# Patient Record
Sex: Female | Born: 1985 | Race: White | Hispanic: No | Marital: Single | Smoking: Current every day smoker
Health system: Southern US, Community
[De-identification: ages and names within clinical notes are randomized; demographics above are authoritative.]

## PROBLEM LIST (undated history)

## (undated) DIAGNOSIS — R519 Headache, unspecified: Secondary | ICD-10-CM

## (undated) DIAGNOSIS — R51 Headache: Secondary | ICD-10-CM

## (undated) HISTORY — PX: DILATION AND CURETTAGE OF UTERUS: SHX78

## (undated) HISTORY — PX: SALPINGECTOMY: SHX328

## (undated) HISTORY — PX: DILITATION & CURRETTAGE/HYSTROSCOPY WITH ESSURE: SHX5573

---
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
## 2006-02-18 ENCOUNTER — Emergency Department: Payer: Self-pay | Admitting: Emergency Medicine

## 2006-08-31 ENCOUNTER — Emergency Department: Payer: Self-pay | Admitting: Emergency Medicine

## 2007-11-27 ENCOUNTER — Emergency Department: Payer: Self-pay | Admitting: Internal Medicine

## 2007-12-15 ENCOUNTER — Emergency Department: Payer: Self-pay | Admitting: Internal Medicine

## 2008-01-19 ENCOUNTER — Emergency Department: Payer: Self-pay | Admitting: Emergency Medicine

## 2008-05-23 ENCOUNTER — Emergency Department: Payer: Self-pay | Admitting: Emergency Medicine

## 2008-06-20 ENCOUNTER — Emergency Department: Payer: Self-pay | Admitting: Emergency Medicine

## 2008-09-11 ENCOUNTER — Emergency Department: Payer: Self-pay | Admitting: Emergency Medicine

## 2009-01-14 ENCOUNTER — Emergency Department: Payer: Self-pay | Admitting: Emergency Medicine

## 2009-01-15 ENCOUNTER — Inpatient Hospital Stay: Payer: Self-pay | Admitting: Unknown Physician Specialty

## 2009-02-06 ENCOUNTER — Emergency Department: Payer: Self-pay | Admitting: Unknown Physician Specialty

## 2009-02-07 ENCOUNTER — Emergency Department: Payer: Self-pay | Admitting: Emergency Medicine

## 2009-06-14 ENCOUNTER — Emergency Department: Payer: Self-pay | Admitting: Emergency Medicine

## 2009-06-21 ENCOUNTER — Emergency Department: Payer: Self-pay | Admitting: Emergency Medicine

## 2009-08-12 ENCOUNTER — Emergency Department: Payer: Self-pay | Admitting: Emergency Medicine

## 2009-08-27 ENCOUNTER — Emergency Department: Payer: Self-pay | Admitting: Emergency Medicine

## 2009-12-10 ENCOUNTER — Emergency Department: Payer: Self-pay | Admitting: Emergency Medicine

## 2010-01-01 ENCOUNTER — Ambulatory Visit: Payer: Self-pay | Admitting: Unknown Physician Specialty

## 2010-10-19 ENCOUNTER — Emergency Department: Payer: Self-pay | Admitting: Emergency Medicine

## 2011-01-13 ENCOUNTER — Emergency Department: Payer: Self-pay | Admitting: Emergency Medicine

## 2011-09-19 ENCOUNTER — Emergency Department: Payer: Self-pay | Admitting: Emergency Medicine

## 2011-09-21 ENCOUNTER — Emergency Department: Payer: Self-pay | Admitting: Emergency Medicine

## 2011-09-25 ENCOUNTER — Emergency Department: Payer: Self-pay | Admitting: Emergency Medicine

## 2011-11-17 ENCOUNTER — Emergency Department: Payer: Self-pay | Admitting: *Deleted

## 2011-11-19 ENCOUNTER — Emergency Department: Payer: Self-pay | Admitting: Unknown Physician Specialty

## 2011-11-21 ENCOUNTER — Emergency Department: Payer: Self-pay | Admitting: Unknown Physician Specialty

## 2011-11-22 LAB — WOUND AEROBIC CULTURE

## 2012-02-02 ENCOUNTER — Emergency Department: Payer: Self-pay | Admitting: Emergency Medicine

## 2012-02-03 ENCOUNTER — Emergency Department: Payer: Self-pay | Admitting: Emergency Medicine

## 2012-04-09 ENCOUNTER — Emergency Department: Payer: Self-pay | Admitting: Emergency Medicine

## 2012-07-20 ENCOUNTER — Emergency Department: Payer: Self-pay | Admitting: Emergency Medicine

## 2012-08-12 ENCOUNTER — Ambulatory Visit: Payer: Self-pay | Admitting: Obstetrics & Gynecology

## 2012-08-12 LAB — BASIC METABOLIC PANEL
BUN: 12 mg/dL (ref 7–18)
Calcium, Total: 9.4 mg/dL (ref 8.5–10.1)
Co2: 25 mmol/L (ref 21–32)
Creatinine: 0.73 mg/dL (ref 0.60–1.30)
EGFR (African American): >60
EGFR (Non-African Amer.): >60
Glucose: 101 mg/dL — ABNORMAL HIGH (ref 65–99)
Potassium: 3.8 mmol/L (ref 3.5–5.1)
Sodium: 138 mmol/L (ref 136–145)

## 2012-08-12 LAB — PREGNANCY, URINE: Pregnancy Test, Urine: NEGATIVE m[IU]/mL

## 2012-08-24 ENCOUNTER — Ambulatory Visit: Payer: Self-pay | Admitting: Obstetrics & Gynecology

## 2013-01-05 DIAGNOSIS — N946 Dysmenorrhea, unspecified: Secondary | ICD-10-CM | POA: Insufficient documentation

## 2013-01-05 DIAGNOSIS — N809 Endometriosis, unspecified: Secondary | ICD-10-CM | POA: Insufficient documentation

## 2013-02-02 ENCOUNTER — Emergency Department: Payer: Self-pay | Admitting: Emergency Medicine

## 2013-02-02 LAB — URINALYSIS, COMPLETE
Bacteria: NONE SEEN
WBC UR: 14 /HPF (ref 0–5)

## 2013-02-02 LAB — COMPREHENSIVE METABOLIC PANEL
Albumin: 4.1 g/dL (ref 3.4–5.0)
BUN: 9 mg/dL (ref 7–18)
Bilirubin,Total: 0.2 mg/dL (ref 0.2–1.0)
Calcium, Total: 9.1 mg/dL (ref 8.5–10.1)
Co2: 23 mmol/L (ref 21–32)
Creatinine: 0.67 mg/dL (ref 0.60–1.30)
EGFR (Non-African Amer.): >60
Osmolality: 276 (ref 275–301)
Potassium: 3.4 mmol/L — ABNORMAL LOW (ref 3.5–5.1)
SGOT(AST): 25 U/L (ref 15–37)
SGPT (ALT): 22 U/L (ref 12–78)
Total Protein: 8.1 g/dL (ref 6.4–8.2)

## 2013-02-02 LAB — CBC
HCT: 39.2 % (ref 35.0–47.0)
HGB: 13.4 g/dL (ref 12.0–16.0)
MCH: 30.6 pg (ref 26.0–34.0)
MCHC: 34.1 g/dL (ref 32.0–36.0)
MCV: 90 fL (ref 80–100)
RDW: 13.1 % (ref 11.5–14.5)

## 2013-02-02 LAB — PREGNANCY, URINE: Pregnancy Test, Urine: NEGATIVE m[IU]/mL

## 2013-02-03 ENCOUNTER — Emergency Department: Payer: Self-pay | Admitting: Emergency Medicine

## 2013-02-03 LAB — CBC WITH DIFFERENTIAL/PLATELET
Basophil #: 0.1 10*3/uL (ref 0.0–0.1)
Basophil %: 0.5 %
Eosinophil #: 0.2 10*3/uL (ref 0.0–0.7)
Eosinophil %: 1.3 %
HCT: 37.4 % (ref 35.0–47.0)
Lymphocyte %: 30.7 %
MCH: 31.4 pg (ref 26.0–34.0)
MCV: 90 fL (ref 80–100)
Monocyte %: 5.7 %
Neutrophil %: 61.8 %
Platelet: 304 10*3/uL (ref 150–440)
RBC: 4.15 10*6/uL (ref 3.80–5.20)
RDW: 12.8 % (ref 11.5–14.5)
WBC: 17.7 10*3/uL — ABNORMAL HIGH (ref 3.6–11.0)

## 2013-02-03 LAB — URINALYSIS, COMPLETE
Bacteria: NONE SEEN
Bilirubin,UR: NEGATIVE
Glucose,UR: NEGATIVE mg/dL (ref 0–75)
Ketone: NEGATIVE
Protein: 100
WBC UR: 12 /HPF (ref 0–5)

## 2013-02-03 LAB — GC/CHLAMYDIA PROBE AMP

## 2013-02-05 LAB — URINE CULTURE

## 2014-06-20 ENCOUNTER — Ambulatory Visit: Payer: Self-pay | Admitting: Obstetrics & Gynecology

## 2014-07-11 ENCOUNTER — Ambulatory Visit: Payer: Self-pay | Admitting: Obstetrics & Gynecology

## 2014-07-11 LAB — CBC
HCT: 41.1 % (ref 35.0–47.0)
HGB: 13.4 g/dL (ref 12.0–16.0)
MCH: 30.7 pg (ref 26.0–34.0)
MCHC: 32.7 g/dL (ref 32.0–36.0)
MCV: 94 fL (ref 80–100)
Platelet: 277 10*3/uL (ref 150–440)
RBC: 4.37 10*6/uL (ref 3.80–5.20)
RDW: 12.7 % (ref 11.5–14.5)
WBC: 13.1 10*3/uL — ABNORMAL HIGH (ref 3.6–11.0)

## 2014-07-14 ENCOUNTER — Ambulatory Visit: Payer: Self-pay | Admitting: Obstetrics & Gynecology

## 2014-07-17 LAB — PATHOLOGY REPORT

## 2015-01-16 NOTE — Op Note (Signed)
PATIENT NAME:  Sylvia George, Sylvia George MR#:  [id number] DATE OF BIRTH:  November 17, 1985  DATE OF PROCEDURE:  08/24/2012  PREOPERATIVE DIAGNOSIS: Pelvic pain.   POSTOPERATIVE DIAGNOSES: Pelvic pain, left salpingitis, pelvic inflammatory disease, left hydrosalpinx, and Fitz-Hugh Curtis syndrome.   PROCEDURE PERFORMED: Operative laparoscopy with left salpingectomy and chromopertubation.   SURGEON: Glean Salen, M.D.   ANESTHESIA: General.   ESTIMATED BLOOD LOSS: Minimal.   COMPLICATIONS: None.   FINDINGS: Rochele Raring syndrome, pelvic inflammatory disease, left salpingitis with obstructed left tube related to hydrosalpinx, absence of right fallopian tube, and normal ovaries and uterus. No evidence for endometriosis.   DISPOSITION: To the recovery room in stable condition.   TECHNIQUE: The patient is prepped and draped in the usual sterile fashion after adequate anesthesia is obtained, in the dorsal lithotomy position. A catheter is used to drain the bladder. A uterine manipulator device is placed on the cervix and uterus with methylene blue dye syringe attached for later infiltration to test for patency of the fallopian tubes. Attention is then turned to the abdomen where a Veress needle is inserted through a 5 mm infraumbilical incision after Marcaine is used to anesthetize the skin. Veress needle placement is confirmed using the hanging drop technique and the abdomen is then insufflated with CO2 gas. A 5 mm trocar is then inserted under visualization with the laparoscope with no injuries or bleeding noted. The patient is placed in Trendelenburg positioning and the above-mentioned findings are visualized. A 5 mm trocar is placed through the right lower quadrant incision and is later converted to an 11 mm trocar when used to extract the left fallopian tube, in an Endopouch. Using the Harmonic scalpel, the left fallopian tube is identified and freed from any adhesions. Injection of methylene blue dye  through the uterine manipulator is then performed. It is seen to fill the tube but is not able to excrete out the tube due to blunting and obstructed fimbriated ends. There is no right fallopian tube and no dye passing on this side as well. Due to the significant nature of the disease of the left fallopian tube and unlikelihood of it ever being able to be patent for fertility purposes and due to the likelihood this is the etiology for the chronic pelvic pain, it is excised. The Harmonic scalpel is used to dissect along the fallopian tubes mesenteric border with preservation of the main blood supply to the ovary. Once the tube is completely freed, it is placed in an Endopouch through the 11 mm trocar, in the right lower quadrant, and removed easily. Excellent hemostasis is noted. There is no bleeding visualized. The above-mentioned findings are well documented. Gas is expelled. Trocars are removed. Dermabond is applied to the skin. Uterine manipulator is removed at this time. The patient goes to the recovery room in stable condition. All sponge, instrument, and needle counts are correct. ____________________________ R. Barnett Applebaum, MD rph:slb D: 08/24/2012 13:58:04 ET T: 08/24/2012 14:11:14 ET JOB#: [id number]  cc: Glean Salen, MD, <Dictator> Gae Dry MD ELECTRONICALLY SIGNED 08/24/2012 14:31

## 2015-01-20 NOTE — Op Note (Signed)
PATIENT NAME:  Sylvia George, Sylvia George MR#:  [id number] DATE OF BIRTH:  Apr 06, 1986  DATE OF PROCEDURE:  07/14/2014  PREOPERATIVE DIAGNOSIS: Menorrhagia.   POSTOPERATIVE DIAGNOSIS: Menorrhagia.   PROCEDURE PERFORMED: Hysteroscopy and dilation and curettage with endometrial ablation.   SURGEON: Glean Salen, M.D.   ASSISTANT: Ward MD   ANESTHESIA: General.   ESTIMATED BLOOD LOSS: Minimal.   COMPLICATIONS: None.   FINDINGS: Normal lining of the uterus.   NovaSure was performed with measurements of: Uterine length was 4.5 cm, uterine width was 4.3 cm, power was 106 and time of procedure was 80 seconds.  DISPOSITION: To the recovery room in stable condition.   TECHNIQUE: The patient is prepped and draped in the usual sterile fashion after adequate anesthesia is obtained in the dorsal lithotomy position. Bladder is drained with a Robinson catheter. Speculum is placed and the cervix is identified and grasped with a tenaculum. An endocervical curettage is performed with a Kevorkian curette. The cervix is then dilated to approximately a size 8 Hegar dilator. Hysteroscope is inserted with lactated Ringer's solution used to distend the intrauterine cavity and the above-mentioned findings are visualized. The cornua were visualized on each side without obstruction and no polyps or fibroids were seen. The hysteroscope is removed with a minimal discrepancy of fluid. The NovaSure device is placed after measurements had been obtained for cervical length and width. The instrument is deployed and a cavitation assessment test is performed without difficulty. The ablation procedure is then performed, also without difficulty, for 80 seconds. The NovaSure device is then removed and repeat visualization using the hysteroscope is performed with no signs of injury or perforation and a blanched lining to the intrauterine cavity. Hysteroscope is again removed as well as the tenaculum with no bleeding noted at the tenaculum  site. The patient goes to the recovery room in stable condition. All sponge, instrument, and needle counts are correct.  ____________________________ R. Barnett Applebaum, MD rph:sb D: 07/14/2014 08:29:59 ET T: 07/14/2014 09:28:41 ET JOB#: [id number]  cc: Glean Salen, MD, <Dictator> Gae Dry MD ELECTRONICALLY SIGNED 07/15/2014 0:04

## 2015-09-24 ENCOUNTER — Emergency Department
Admission: EM | Admit: 2015-09-24 | Discharge: 2015-09-24 | Disposition: A | Discharge status: Home / Self Care | Payer: Self-pay | Attending: Emergency Medicine | Admitting: Emergency Medicine

## 2015-09-24 ENCOUNTER — Emergency Department: Payer: Self-pay

## 2015-09-24 ENCOUNTER — Encounter: Payer: Self-pay | Admitting: Emergency Medicine

## 2015-09-24 DIAGNOSIS — Z3202 Encounter for pregnancy test, result negative: Secondary | ICD-10-CM | POA: Insufficient documentation

## 2015-09-24 DIAGNOSIS — R300 Dysuria: Secondary | ICD-10-CM | POA: Insufficient documentation

## 2015-09-24 DIAGNOSIS — R102 Pelvic and perineal pain: Secondary | ICD-10-CM | POA: Insufficient documentation

## 2015-09-24 DIAGNOSIS — R112 Nausea with vomiting, unspecified: Secondary | ICD-10-CM | POA: Insufficient documentation

## 2015-09-24 DIAGNOSIS — F17210 Nicotine dependence, cigarettes, uncomplicated: Secondary | ICD-10-CM | POA: Insufficient documentation

## 2015-09-24 DIAGNOSIS — R1032 Left lower quadrant pain: Secondary | ICD-10-CM | POA: Insufficient documentation

## 2015-09-24 LAB — URINALYSIS COMPLETE WITH MICROSCOPIC (ARMC ONLY)
BILIRUBIN URINE: NEGATIVE
Glucose, UA: NEGATIVE mg/dL
HGB URINE DIPSTICK: NEGATIVE
Ketones, ur: NEGATIVE mg/dL
Leukocytes, UA: NEGATIVE
Nitrite: NEGATIVE
PH: 5 (ref 5.0–8.0)
PROTEIN: 30 mg/dL — AB
Specific Gravity, Urine: 1.028 (ref 1.005–1.030)

## 2015-09-24 LAB — CBC
HEMATOCRIT: 39.3 % (ref 35.0–47.0)
HEMOGLOBIN: 13.5 g/dL (ref 12.0–16.0)
MCH: 30.8 pg (ref 26.0–34.0)
MCHC: 34.4 g/dL (ref 32.0–36.0)
MCV: 89.6 fL (ref 80.0–100.0)
Platelets: 317 10*3/uL (ref 150–440)
RBC: 4.39 MIL/uL (ref 3.80–5.20)
RDW: 12.8 % (ref 11.5–14.5)
WBC: 15 10*3/uL — ABNORMAL HIGH (ref 3.6–11.0)

## 2015-09-24 LAB — COMPREHENSIVE METABOLIC PANEL
ALBUMIN: 4.1 g/dL (ref 3.5–5.0)
ALT: 15 U/L (ref 14–54)
ANION GAP: 7 (ref 5–15)
AST: 17 U/L (ref 15–41)
Alkaline Phosphatase: 65 U/L (ref 38–126)
BUN: 12 mg/dL (ref 6–20)
CHLORIDE: 109 mmol/L (ref 101–111)
CO2: 23 mmol/L (ref 22–32)
Calcium: 9.2 mg/dL (ref 8.9–10.3)
Creatinine, Ser: 0.69 mg/dL (ref 0.44–1.00)
GFR calc non Af Amer: >60 mL/min (ref >60)
GLUCOSE: 126 mg/dL — AB (ref 65–99)
POTASSIUM: 3.6 mmol/L (ref 3.5–5.1)
SODIUM: 139 mmol/L (ref 135–145)
Total Bilirubin: 0.3 mg/dL (ref 0.3–1.2)
Total Protein: 7.4 g/dL (ref 6.5–8.1)

## 2015-09-24 LAB — POCT PREGNANCY, URINE: Preg Test, Ur: NEGATIVE

## 2015-09-24 LAB — LIPASE, BLOOD: LIPASE: 22 U/L (ref 11–51)

## 2015-09-24 MED ORDER — IOHEXOL 350 MG/ML SOLN
80.0000 mL | Freq: Once | INTRAVENOUS | Status: AC | PRN
  Administered 2015-09-24: 80 mL via INTRAVENOUS

## 2015-09-24 MED ORDER — TRAMADOL HCL 50 MG PO TABS: 50.0000 mg | ORAL_TABLET | Freq: Four times a day (QID) | ORAL | Status: DC | PRN

## 2015-09-24 MED ORDER — MORPHINE SULFATE (PF) 4 MG/ML IV SOLN
4.0000 mg | Freq: Once | INTRAVENOUS | Status: AC
  Administered 2015-09-24: 4 mg via INTRAVENOUS
  Filled 2015-09-24: qty 1

## 2015-09-24 MED ORDER — ONDANSETRON HCL 4 MG/2ML IJ SOLN
INTRAMUSCULAR | Status: AC
  Administered 2015-09-24: 4 mg via INTRAVENOUS
  Filled 2015-09-24: qty 2

## 2015-09-24 MED ORDER — ONDANSETRON HCL 4 MG/2ML IJ SOLN
4.0000 mg | Freq: Once | INTRAMUSCULAR | Status: AC | PRN
  Administered 2015-09-24: 4 mg via INTRAVENOUS

## 2015-09-24 MED ORDER — IOHEXOL 240 MG/ML SOLN
25.0000 mL | Freq: Once | INTRAMUSCULAR | Status: AC | PRN
  Administered 2015-09-24: 25 mL via ORAL

## 2015-09-24 NOTE — ED Notes (Signed)
Patient transported to Ultrasound 

## 2015-09-24 NOTE — ED Notes (Signed)
Patient presents to the ED with left lower qudrant/pelvic pain intermittently x 1 month and severe pain since 5am this morning with 4 episodes of vomiting.  Patient reports severe tenderness and reports pain to LLQ when she urinates or passes gas, also hurts worse when she moves.  Patient reports having abdominal surgery about 1 year ago to remove her fallopian tubes and she has a history of ovarian cysts.  Patient denies diarrhea.  Denies urinary frequency and dysuria.

## 2015-09-24 NOTE — Discharge Instructions (Signed)

## 2015-09-24 NOTE — ED Provider Notes (Signed)
[facility] Emergency Department Provider Note  ____________________________________________  Time seen: On arrival  I have reviewed the triage vital signs and the nursing notes.   HISTORY  Chief Complaint Abdominal Pain    HPI Sylvia George is a 29 y.o. female who presents with complaints of left lower quadrant abdominal pain for approximately the last month which has been mild but became more severethis morning at 5 AM. She denies fevers or chills. She has had mild nausea. She reports normal stools. She denies vaginal discharge. She denies the possibility of being pregnant but she does not have normal periods because she had a D&C with endometrial ablation in the past. She does have mild dysuria but no frequency.     History reviewed. No pertinent past medical history.  There are no active problems to display for this patient.   Past Surgical History  Procedure Laterality Date  . Dilitation & currettage/hystroscopy with essure      No current outpatient prescriptions on file.  Allergies Vicodin  No family history on file.  Social History Social History  Substance Use Topics  . Smoking status: Current Every Day Smoker -- 1.00 packs/day    Types: Cigarettes  . Smokeless tobacco: None  . Alcohol Use: No    Review of Systems  Constitutional: Negative for fever. Eyes: Negative for discharge ENT: Negative for sore throat Cardiovascular: Negative for chest pain. Respiratory: Negative for cough Gastrointestinal: No diarrhea Genitourinary: Positive for dysuria, negative for vaginal discharge negative for frequency Musculoskeletal: Negative for back pain. Skin: Negative for rash. Neurological: Negative for headaches Psychiatric: No anxiety    ____________________________________________   PHYSICAL EXAM:  VITAL SIGNS: ED Triage Vitals  Enc Vitals Group     BP 09/24/15 0716 106/50 mmHg     Pulse Rate 09/24/15 0716 65     Resp  09/24/15 0716 18     Temp 09/24/15 0716 97.8 F (36.6 C)     Temp Source 09/24/15 0716 Oral     SpO2 09/24/15 0716 98 %     Weight 09/24/15 0716 160 lb (72.576 kg)     Height 09/24/15 0716 5\' 2"  (1.575 m)     Head Cir --      Peak Flow --      Pain Score 09/24/15 0717 8     Pain Loc --      Pain Edu? --      Excl. in [place]? --      Constitutional: Alert and oriented. Well appearing and in no distress. Eyes: Conjunctivae are normal.  ENT   Head: Normocephalic and atraumatic.   Mouth/Throat: Mucous membranes are moist. Cardiovascular: Normal rate, regular rhythm. Normal and symmetric distal pulses are present in all extremities. No murmurs, rubs, or gallops. Respiratory: Normal respiratory effort without tachypnea nor retractions. Breath sounds are clear and equal bilaterally.  Gastrointestinal: Patient with significant left lower quadrant tenderness to palpation. No distention. There is no CVA tenderness. Genitourinary: Deferred for specialist Musculoskeletal: Nontender with normal range of motion in all extremities. No lower extremity tenderness nor edema. Neurologic:  Normal speech and language. No gross focal neurologic deficits are appreciated. Skin:  Skin is warm, dry and intact. No rash noted. Psychiatric: Mood and affect are normal. Patient exhibits appropriate insight and judgment.  ____________________________________________    LABS (pertinent positives/negatives)  Labs Reviewed  COMPREHENSIVE METABOLIC PANEL - Abnormal; Notable for the following:    Glucose, Bld 126 (*)    All other components within  normal limits  CBC - Abnormal; Notable for the following:    WBC 15.0 (*)    All other components within normal limits  LIPASE, BLOOD  URINALYSIS COMPLETEWITH MICROSCOPIC ([facility] ONLY)  POC URINE PREG, ED    ____________________________________________   EKG  None  ____________________________________________    RADIOLOGY I have personally reviewed  any xrays that were ordered on this patient: CT abdomen pelvis is negative Ultrasound pelvis is unremarkable  ____________________________________________   PROCEDURES  Procedure(s) performed: none  Critical Care performed: none  ____________________________________________   INITIAL IMPRESSION / ASSESSMENT AND PLAN / ED COURSE  Pertinent labs & imaging results that were available during my care of the patient were reviewed by me and considered in my medical decision making (see chart for details).  Patient presents with primarily left lower quadrant abdominal pain. She is a history of constipation. Given the location of pain differential includes colitis, ureterolithiasis (less likely) ovarian cyst, And even less likely ovarian torsion given prolonged time ofPain   Patient feels better after IV pain medications. Her imaging studies are unremarkable. Her labs are benign. We will discharge with outpatient follow-up with her gynecologist.  ____________________________________________   FINAL CLINICAL IMPRESSION(S) / ED DIAGNOSES  Final diagnoses:  Pelvic pain in female  Pelvic pain in female     Lavonia Drafts, MD 09/24/15 [id number]

## 2015-09-27 ENCOUNTER — Emergency Department
Admission: EM | Admit: 2015-09-27 | Discharge: 2015-09-27 | Disposition: A | Discharge status: Home / Self Care | Payer: Self-pay | Attending: Student | Admitting: Student

## 2015-09-27 DIAGNOSIS — R102 Pelvic and perineal pain: Secondary | ICD-10-CM | POA: Insufficient documentation

## 2015-09-27 DIAGNOSIS — R112 Nausea with vomiting, unspecified: Secondary | ICD-10-CM | POA: Insufficient documentation

## 2015-09-27 DIAGNOSIS — Z3202 Encounter for pregnancy test, result negative: Secondary | ICD-10-CM | POA: Insufficient documentation

## 2015-09-27 DIAGNOSIS — F17210 Nicotine dependence, cigarettes, uncomplicated: Secondary | ICD-10-CM | POA: Insufficient documentation

## 2015-09-27 DIAGNOSIS — K5900 Constipation, unspecified: Secondary | ICD-10-CM | POA: Insufficient documentation

## 2015-09-27 LAB — CBC
HEMATOCRIT: 41.5 % (ref 35.0–47.0)
Hemoglobin: 14.2 g/dL (ref 12.0–16.0)
MCH: 31.5 pg (ref 26.0–34.0)
MCHC: 34.2 g/dL (ref 32.0–36.0)
MCV: 92.1 fL (ref 80.0–100.0)
PLATELETS: 350 10*3/uL (ref 150–440)
RBC: 4.51 MIL/uL (ref 3.80–5.20)
RDW: 12.2 % (ref 11.5–14.5)
WBC: 11.6 10*3/uL — ABNORMAL HIGH (ref 3.6–11.0)

## 2015-09-27 LAB — URINALYSIS COMPLETE WITH MICROSCOPIC (ARMC ONLY)
BACTERIA UA: NONE SEEN
BILIRUBIN URINE: NEGATIVE
GLUCOSE, UA: NEGATIVE mg/dL
HGB URINE DIPSTICK: NEGATIVE
Ketones, ur: NEGATIVE mg/dL
Leukocytes, UA: NEGATIVE
Nitrite: NEGATIVE
Protein, ur: NEGATIVE mg/dL
RBC / HPF: NONE SEEN RBC/hpf (ref 0–5)
Specific Gravity, Urine: 1.011 (ref 1.005–1.030)
pH: 7 (ref 5.0–8.0)

## 2015-09-27 LAB — COMPREHENSIVE METABOLIC PANEL
ALK PHOS: 75 U/L (ref 38–126)
ALT: 14 U/L (ref 14–54)
AST: 20 U/L (ref 15–41)
Albumin: 4.5 g/dL (ref 3.5–5.0)
Anion gap: 5 (ref 5–15)
BUN: 9 mg/dL (ref 6–20)
CHLORIDE: 106 mmol/L (ref 101–111)
CO2: 28 mmol/L (ref 22–32)
CREATININE: 0.78 mg/dL (ref 0.44–1.00)
Calcium: 9.4 mg/dL (ref 8.9–10.3)
GLUCOSE: 105 mg/dL — AB (ref 65–99)
Potassium: 3.7 mmol/L (ref 3.5–5.1)
Sodium: 139 mmol/L (ref 135–145)
Total Bilirubin: 0.4 mg/dL (ref 0.3–1.2)
Total Protein: 8.2 g/dL — ABNORMAL HIGH (ref 6.5–8.1)

## 2015-09-27 LAB — PREGNANCY, URINE: PREG TEST UR: NEGATIVE

## 2015-09-27 LAB — WET PREP, GENITAL
Clue Cells Wet Prep HPF POC: NONE SEEN
SPERM: NONE SEEN
TRICH WET PREP: NONE SEEN
WBC WET PREP: NONE SEEN
YEAST WET PREP: NONE SEEN

## 2015-09-27 LAB — LIPASE, BLOOD: LIPASE: 22 U/L (ref 11–51)

## 2015-09-27 MED ORDER — ONDANSETRON HCL 4 MG/2ML IJ SOLN
4.0000 mg | Freq: Once | INTRAMUSCULAR | Status: AC
  Administered 2015-09-27: 4 mg via INTRAVENOUS
  Filled 2015-09-27: qty 2

## 2015-09-27 MED ORDER — PEG 3350-KCL-NABCB-NACL-NASULF 236 G PO SOLR: 4000.0000 mL | Freq: Once | ORAL | Status: DC

## 2015-09-27 MED ORDER — KETOROLAC TROMETHAMINE 30 MG/ML IJ SOLN
30.0000 mg | Freq: Once | INTRAMUSCULAR | Status: AC
  Administered 2015-09-27: 30 mg via INTRAVENOUS
  Filled 2015-09-27: qty 1

## 2015-09-27 MED ORDER — ONDANSETRON 4 MG PO TBDP: 4.0000 mg | ORAL_TABLET | Freq: Three times a day (TID) | ORAL | Status: DC | PRN

## 2015-09-27 NOTE — ED Provider Notes (Signed)
[facility] Emergency Department Provider Note  ____________________________________________  Time seen: Approximately 8:58 PM  I have reviewed the triage vital signs and the nursing notes.   HISTORY  Chief Complaint Pelvic Pain    HPI Sylvia George is a 28 y.o. female with history of left salpingitis with obstructed left tube related to hydrosalpinx treated with salpingectomy, absence of right fallopian tube who presents for evaluation of persistent left lower quadrant abdominal/pelvic pain, gradual onset 1 month ago worse over the past 3 days, constant since onset, currently moderate. The patient was seen in this emergency department on 09/24/2015 for similar symptoms. She had an ultrasound of the pelvis with doppler as well as a CT of the abdomen and pelvis which were unrevealing for a source of her pain with exception of noted constipation. Though constipation was noted on CT scan,  she reports that she was not informed of this. She was discharged with tramadol which she has been taking however she has had persistent pain. She reports she has had 2-3 episodes of nonbloody nonbilious emesis but none today. She did drink a small amount of magnesium 2 days ago after which she had a bowel movement and she felt somewhat better. No fevers or chills. No chest pain or difficulty breathing. She denies any abnormal vaginal bleeding or vaginal discharge.   History reviewed. No pertinent past medical history.  There are no active problems to display for this patient.   Past Surgical History  Procedure Laterality Date  . Dilitation & currettage/hystroscopy with essure      Current Outpatient Rx  Name  Route  Sig  Dispense  Refill  . ibuprofen (ADVIL,MOTRIN) 200 MG tablet   Oral   Take 200 mg by mouth every 4 (four) hours as needed.         . ondansetron (ZOFRAN ODT) 4 MG disintegrating tablet   Oral   Take 1 tablet (4 mg total) by mouth every 8 (eight) hours  as needed for nausea or vomiting.   9 tablet   0   . polyethylene glycol (GOLYTELY) 236 g solution   Oral   Take 4,000 mLs by mouth once.   4000 mL   0   . traMADol (ULTRAM) 50 MG tablet   Oral   Take 1 tablet (50 mg total) by mouth every 6 (six) hours as needed.   20 tablet   0     Allergies Vicodin  No family history on file.  Social History Social History  Substance Use Topics  . Smoking status: Current Every Day Smoker -- 1.00 packs/day    Types: Cigarettes  . Smokeless tobacco: None  . Alcohol Use: No    Review of Systems Constitutional: No fever/chills Eyes: No visual changes. ENT: No sore throat. Cardiovascular: Denies chest pain. Respiratory: Denies shortness of breath. Gastrointestinal: + abdominal pain.  + nausea, + vomiting.  No diarrhea.  + constipation. Genitourinary: Negative for dysuria. Musculoskeletal: Negative for back pain. Skin: Negative for rash. Neurological: Negative for headaches, focal weakness or numbness.  10-point ROS otherwise negative.  ____________________________________________   PHYSICAL EXAM:  VITAL SIGNS: ED Triage Vitals  Enc Vitals Group     BP 09/27/15 1854 114/72 mmHg     Pulse Rate 09/27/15 1854 97     Resp 09/27/15 1854 16     Temp 09/27/15 1854 98.7 F (37.1 C)     Temp Source 09/27/15 1854 Oral     SpO2 09/27/15 1854 98 %  Weight 09/27/15 1854 165 lb (74.844 kg)     Height 09/27/15 1854 5\' 3"  (1.6 m)     Head Cir --      Peak Flow --      Pain Score 09/27/15 1855 7     Pain Loc --      Pain Edu? --      Excl. in [place]? --     Constitutional: Alert and oriented. Well appearing and in no acute distress. Eyes: Conjunctivae are normal. PERRL. EOMI. Head: Atraumatic. Nose: No congestion/rhinnorhea. Mouth/Throat: Mucous membranes are moist.  Oropharynx non-erythematous. Neck: No stridor.  Cardiovascular: Normal rate, regular rhythm. Grossly normal heart sounds.  Good peripheral  circulation. Respiratory: Normal respiratory effort.  No retractions. Lungs CTAB. Gastrointestinal: Soft with faint tenderness in the left lower quadrant. No CVA tenderness. Pelvic: Small amount of thin white non-malodorous discharge from a closed os. No cervical motion tenderness or bimanual tenderness. Musculoskeletal: No lower extremity tenderness nor edema.  No joint effusions. Neurologic:  Normal speech and language. No gross focal neurologic deficits are appreciated. No gait instability. Skin:  Skin is warm, dry and intact. No rash noted. Psychiatric: Mood and affect are normal. Speech and behavior are normal.  ____________________________________________   LABS (all labs ordered are listed, but only abnormal results are displayed)  Labs Reviewed  COMPREHENSIVE METABOLIC PANEL - Abnormal; Notable for the following:    Glucose, Bld 105 (*)    Total Protein 8.2 (*)    All other components within normal limits  CBC - Abnormal; Notable for the following:    WBC 11.6 (*)    All other components within normal limits  URINALYSIS COMPLETEWITH MICROSCOPIC ([facility] ONLY) - Abnormal; Notable for the following:    Color, Urine YELLOW (*)    APPearance CLEAR (*)    Squamous Epithelial / LPF 0-5 (*)    All other components within normal limits  WET PREP, GENITAL  CHLAMYDIA/NGC RT PCR ([facility] ONLY)  LIPASE, BLOOD  PREGNANCY, URINE   ____________________________________________  EKG  none ____________________________________________  RADIOLOGY  none ____________________________________________   PROCEDURES  Procedure(s) performed: None  Critical Care performed: No  ____________________________________________   INITIAL IMPRESSION / ASSESSMENT AND PLAN / ED COURSE  Pertinent labs & imaging results that were available during my care of the patient were reviewed by me and considered in my medical decision making (see chart for details).  Sylvia George is a 29  y.o. female with history of left salpingitis with obstructed left tube related to hydrosalpinx treated with salpingectomy, absence of right fallopian tube who presents for evaluation of persistent left lower quadrant abdominal/pelvic pain. On exam, she is very well-appearing and in no acute distress. Vital signs are stable, she is afebrile. She has faint tenderness in the left lower quadrant, no rebound, no guarding. Labs reviewed and are notable for resolving leukocytosis when compared to labs which were obtained 3 days ago. Normal CMP, normal lipase, negative pregnancy test, urinalysis is negative. Wet prep, GC chlamydia pending. I discussed with her that she may feel better after a bowel cleanout given her CT scan that showed significant stool burden. She is well-appearing and I do not think she requires any additional imaging this evening. Wet Prep is pending after which anticipate discharge home with Zofran and GoLYTELY. We discussed return precautions, need for close OB/GYN follow-up. She is comfortable with the discharge plan and will follow up with Dr. Kenton Kingfisher on 10/09/2015.  ----------------------------------------- 10:02 PM on 09/27/2015 ----------------------------------------- Wet prep  negative. Patient with mild improvement of her pain. Tolerating by mouth intake without vomiting. DC with return precautions as above.  ____________________________________________   FINAL CLINICAL IMPRESSION(S) / ED DIAGNOSES  Final diagnoses:  Pelvic pain in female  Constipation, unspecified constipation type      Joanne Gavel, MD 09/27/15 2203

## 2015-09-27 NOTE — ED Notes (Signed)
Pt c/o pelvic pain for the past month, with N/V since Monday. Sates she was seen here on Monday for the same sx and had an ultrasound and CT scan that was negative.

## 2015-09-28 LAB — CHLAMYDIA/NGC RT PCR (ARMC ONLY)
Chlamydia Tr: NOT DETECTED
N gonorrhoeae: NOT DETECTED

## 2015-10-25 ENCOUNTER — Encounter (HOSPITAL_COMMUNITY): Payer: Self-pay

## 2015-10-25 ENCOUNTER — Emergency Department (HOSPITAL_COMMUNITY)
Admission: EM | Admit: 2015-10-25 | Discharge: 2015-10-25 | Disposition: A | Discharge status: Home / Self Care | Payer: Self-pay | Attending: Emergency Medicine | Admitting: Emergency Medicine

## 2015-10-25 DIAGNOSIS — R1032 Left lower quadrant pain: Secondary | ICD-10-CM | POA: Insufficient documentation

## 2015-10-25 DIAGNOSIS — Z9889 Other specified postprocedural states: Secondary | ICD-10-CM | POA: Insufficient documentation

## 2015-10-25 DIAGNOSIS — F17210 Nicotine dependence, cigarettes, uncomplicated: Secondary | ICD-10-CM | POA: Insufficient documentation

## 2015-10-25 DIAGNOSIS — Z3202 Encounter for pregnancy test, result negative: Secondary | ICD-10-CM | POA: Insufficient documentation

## 2015-10-25 LAB — PREGNANCY, URINE: PREG TEST UR: NEGATIVE

## 2015-10-25 MED ORDER — CYCLOBENZAPRINE HCL 5 MG PO TABS: 5.0000 mg | ORAL_TABLET | Freq: Three times a day (TID) | ORAL | Status: DC | PRN

## 2015-10-25 MED ORDER — NAPROXEN 500 MG PO TABS: ORAL_TABLET | ORAL | Status: DC

## 2015-10-25 MED ORDER — KETOROLAC TROMETHAMINE 30 MG/ML IJ SOLN
60.0000 mg | Freq: Once | INTRAMUSCULAR | Status: AC
  Administered 2015-10-25: 60 mg via INTRAMUSCULAR
  Filled 2015-10-25: qty 2

## 2015-10-25 NOTE — Discharge Instructions (Signed)
Take the naproxen for pain. You really need to see Dr Kenton Kingfisher and have him evaluate your pain. Return to the ED if you get a fever.

## 2015-10-25 NOTE — ED Notes (Signed)
Pt states she has been having lower abd/pelvic pain for about a month, states she has been evaluated for same and had ct scan and pelvic exam, told it was constipation.  Pt states she does not feel constipated and has 2 bm's per day.

## 2015-10-25 NOTE — ED Provider Notes (Signed)
CSN: [id number]     Arrival date & time 10/25/15  [id number] History   First MD Initiated Contact with Patient 10/25/15 [phone]   Chief Complaint  Patient presents with  . Abdominal Pain     (Consider location/radiation/quality/duration/timing/severity/associated sxs/prior Treatment) HPI patient reports she has been having left lower pelvic pain for the past month. She states initially was intermittent however it has been constant now since the end of December. She states the pain is sharp. She states it hurts more when she lifts things, and it feels better with a hot bath. She states she had vomiting only one day which was January 2. She denies fever, or diarrhea, or vaginal discharge. She denies urinary symptoms. She came in tonight as the pain is getting sharper. She states she takes ibuprofen 2-3 tablets up to 4 times a day. She has a significant GYN history in that she had a uterine ablation done in September 2014 and has not had a period since. She also states she had her right tube removed when she was 30 years old which is 7 years ago, and in 2012 she had her left tube removed.  GYN Dr Kenton Kingfisher in [place]  History reviewed. No pertinent past medical history. Past Surgical History  Procedure Laterality Date  . Dilitation & currettage/hystroscopy with essure    . Salpingectomy     No family history on file. Social History  Substance Use Topics  . Smoking status: Current Every Day Smoker -- 1.00 packs/day    Types: Cigarettes  . Smokeless tobacco: None  . Alcohol Use: No   employed  OB History    No data available     Review of Systems  All other systems reviewed and are negative.     Allergies  Vicodin  Home Medications   Prior to Admission medications   Medication Sig Start Date End Date Taking? Authorizing Provider  ibuprofen (ADVIL,MOTRIN) 200 MG tablet Take 200 mg by mouth every 4 (four) hours as needed.   Yes Historical Provider, MD  cyclobenzaprine (FLEXERIL) 5 MG  tablet Take 1 tablet (5 mg total) by mouth 3 (three) times daily as needed (muscle pain). 10/25/15   Rolland Porter, MD  naproxen (NAPROSYN) 500 MG tablet Take 1 po BID with food prn pain 10/25/15   Rolland Porter, MD  ondansetron (ZOFRAN ODT) 4 MG disintegrating tablet Take 1 tablet (4 mg total) by mouth every 8 (eight) hours as needed for nausea or vomiting. 09/27/15   Joanne Gavel, MD  polyethylene glycol (GOLYTELY) 236 g solution Take 4,000 mLs by mouth once. 09/27/15   Joanne Gavel, MD  traMADol (ULTRAM) 50 MG tablet Take 1 tablet (50 mg total) by mouth every 6 (six) hours as needed. 09/24/15 09/23/16  Lavonia Drafts, MD   BP 121/62 mmHg  Pulse 88  Temp(Src) 97.8 F (36.6 C) (Oral)  Resp 20  Ht 5\' 2"  (1.575 m)  Wt 170 lb (77.111 kg)  BMI 31.09 kg/m2  SpO2 98%  Vital signs normal   Physical Exam  Constitutional: She is oriented to person, place, and time. She appears well-developed and well-nourished.  Non-toxic appearance. She does not appear ill. No distress.  Patient sitting up in the stretcher playing on her tablet in no distress.  HENT:  Head: Normocephalic and atraumatic.  Right Ear: External ear normal.  Left Ear: External ear normal.  Nose: Nose normal. No mucosal edema or rhinorrhea.  Mouth/Throat: Oropharynx is clear and moist and mucous membranes are  normal. No dental abscesses or uvula swelling.  Eyes: Conjunctivae and EOM are normal. Pupils are equal, round, and reactive to light.  Neck: Normal range of motion and full passive range of motion without pain. Neck supple.  Cardiovascular: Normal rate, regular rhythm and normal heart sounds.  Exam reveals no gallop and no friction rub.   No murmur heard. Pulmonary/Chest: Effort normal and breath sounds normal. No respiratory distress. She has no wheezes. She has no rhonchi. She has no rales. She exhibits no tenderness and no crepitus.  Abdominal: Soft. Normal appearance and bowel sounds are normal. She exhibits no distension. There  is tenderness. There is no rebound and no guarding.    Musculoskeletal: Normal range of motion. She exhibits no edema or tenderness.  Moves all extremities well.   Neurological: She is alert and oriented to person, place, and time. She has normal strength. No cranial nerve deficit.  Skin: Skin is warm, dry and intact. No rash noted. No erythema. No pallor.  Psychiatric: She has a normal mood and affect. Her speech is normal and behavior is normal. Her mood appears not anxious.  Nursing note and vitals reviewed.   ED Course  Procedures (including critical care time)  Medications  ketorolac (TORADOL) 30 MG/ML injection 60 mg (60 mg Intramuscular Given 10/25/15 [id number])    Patient drove herself to the ED. She was given Toradol for pain. She states she has talked to her GYN office and the office visit is $75. She strongly encouraged to save the money and go see Dr. Kenton Kingfisher. She states she doesn't qualify for Medicaid and she did not sign up for insurance when they had open enrollment at work. At this point I do not feel like it would be prudent to repeat the CT scan and ultrasound. She may have some scar tissue present that is not able to be detected by the studies and she will most likely need a laparoscopic procedure to look at her ovary and see what is causing the pain.  Review of her prior records shows in October 2015 she had a hysteroscopy and a D&C done with endometrial ablation by Dr. Kenton Kingfisher. In 2013 she had operative laparoscopic procedure with left salpingectomy and chromopertubation done by Dr. Kenton Kingfisher. patient was seen at [facility] ER on December 26 and had a ultrasound of her pelvis and CT scan of her abdomen/pelvis done which was unrevealing. She was seen again on December 29 and had a pelvic exam done at that time. I reviewed her results her wet prep was negative and her GC and chlamydia were also negative.   Labs Review Results for orders placed or performed during the hospital encounter  of 10/25/15  Pregnancy, urine  Result Value Ref Range   Preg Test, Ur NEGATIVE NEGATIVE     MDM   Final diagnoses:  Abdominal pain, left lower quadrant    New Prescriptions   CYCLOBENZAPRINE (FLEXERIL) 5 MG TABLET    Take 1 tablet (5 mg total) by mouth 3 (three) times daily as needed (muscle pain).   NAPROXEN (NAPROSYN) 500 MG TABLET    Take 1 po BID with food prn pain    Plan discharge  Rolland Porter, MD, Barbette Or, MD 10/25/15 [phone]

## 2016-03-16 ENCOUNTER — Encounter: Payer: Self-pay | Admitting: Emergency Medicine

## 2016-03-16 ENCOUNTER — Emergency Department: Payer: Self-pay

## 2016-03-16 ENCOUNTER — Emergency Department
Admission: EM | Admit: 2016-03-16 | Discharge: 2016-03-16 | Disposition: A | Discharge status: Home / Self Care | Payer: Self-pay | Attending: Emergency Medicine | Admitting: Emergency Medicine

## 2016-03-16 DIAGNOSIS — F17210 Nicotine dependence, cigarettes, uncomplicated: Secondary | ICD-10-CM | POA: Insufficient documentation

## 2016-03-16 DIAGNOSIS — R1031 Right lower quadrant pain: Secondary | ICD-10-CM | POA: Insufficient documentation

## 2016-03-16 DIAGNOSIS — R1084 Generalized abdominal pain: Secondary | ICD-10-CM

## 2016-03-16 LAB — COMPREHENSIVE METABOLIC PANEL
ALBUMIN: 4.6 g/dL (ref 3.5–5.0)
ALK PHOS: 69 U/L (ref 38–126)
ALT: 19 U/L (ref 14–54)
ANION GAP: 9 (ref 5–15)
AST: 25 U/L (ref 15–41)
BUN: 10 mg/dL (ref 6–20)
CALCIUM: 9.3 mg/dL (ref 8.9–10.3)
CHLORIDE: 105 mmol/L (ref 101–111)
CO2: 22 mmol/L (ref 22–32)
Creatinine, Ser: 0.6 mg/dL (ref 0.44–1.00)
Glucose, Bld: 140 mg/dL — ABNORMAL HIGH (ref 65–99)
Potassium: 3.5 mmol/L (ref 3.5–5.1)
Sodium: 136 mmol/L (ref 135–145)
Total Bilirubin: 0.7 mg/dL (ref 0.3–1.2)
Total Protein: 8 g/dL (ref 6.5–8.1)

## 2016-03-16 LAB — CBC
HCT: 40.8 % (ref 35.0–47.0)
HEMOGLOBIN: 14 g/dL (ref 12.0–16.0)
MCH: 31.2 pg (ref 26.0–34.0)
MCHC: 34.2 g/dL (ref 32.0–36.0)
MCV: 91 fL (ref 80.0–100.0)
PLATELETS: 302 10*3/uL (ref 150–440)
RBC: 4.48 MIL/uL (ref 3.80–5.20)
RDW: 12.7 % (ref 11.5–14.5)
WBC: 11.4 10*3/uL — AB (ref 3.6–11.0)

## 2016-03-16 LAB — URINALYSIS COMPLETE WITH MICROSCOPIC (ARMC ONLY)
BILIRUBIN URINE: NEGATIVE
Bacteria, UA: NONE SEEN
Glucose, UA: NEGATIVE mg/dL
HGB URINE DIPSTICK: NEGATIVE
KETONES UR: NEGATIVE mg/dL
LEUKOCYTES UA: NEGATIVE
Nitrite: NEGATIVE
PH: 7 (ref 5.0–8.0)
PROTEIN: NEGATIVE mg/dL
SPECIFIC GRAVITY, URINE: 1.017 (ref 1.005–1.030)

## 2016-03-16 LAB — LIPASE, BLOOD: LIPASE: 19 U/L (ref 11–51)

## 2016-03-16 LAB — POCT PREGNANCY, URINE: Preg Test, Ur: NEGATIVE

## 2016-03-16 MED ORDER — KETOROLAC TROMETHAMINE 30 MG/ML IJ SOLN
15.0000 mg | Freq: Once | INTRAMUSCULAR | Status: AC
  Administered 2016-03-16: 15 mg via INTRAMUSCULAR

## 2016-03-16 MED ORDER — KETOROLAC TROMETHAMINE 30 MG/ML IJ SOLN
INTRAMUSCULAR | Status: AC
Start: 2016-03-16 — End: 2016-03-16
  Administered 2016-03-16: 15 mg via INTRAMUSCULAR
  Filled 2016-03-16: qty 1

## 2016-03-16 NOTE — ED Notes (Signed)
Pt given something to drink at this time, sitting up in bed drinking and tolerating well.

## 2016-03-16 NOTE — ED Notes (Signed)
MD at bedside. 

## 2016-03-16 NOTE — Discharge Instructions (Signed)
You have been seen in the Emergency Department (ED) for abdominal pain.  Your evaluation did not identify a clear cause of your symptoms but was generally reassuring.  Most likely you have a little bit of free fluid in her abdomen as results of an ovarian cyst that has ruptured, which can be an uncomfortable but normal part of your monthly menstrual cycle (in spite of your uterine ablation).  Please follow up as instructed above regarding todays emergent visit and the symptoms that are bothering you.  Return to the ED if your abdominal pain worsens or fails to improve, you develop bloody vomiting, bloody diarrhea, you are unable to tolerate fluids due to vomiting, fever greater than 101, or other symptoms that concern you.   Abdominal Pain, Adult Many things can cause abdominal pain. Usually, abdominal pain is not caused by a disease and will improve without treatment. It can often be observed and treated at home. Your health care provider will do a physical exam and possibly order blood tests and X-rays to help determine the seriousness of your pain. However, in many cases, more time must pass before a clear cause of the pain can be found. Before that point, your health care provider may not know if you need more testing or further treatment. HOME CARE INSTRUCTIONS Monitor your abdominal pain for any changes. The following actions may help to alleviate any discomfort you are experiencing:  Only take over-the-counter or prescription medicines as directed by your health care provider.  Do not take laxatives unless directed to do so by your health care provider.  Try a clear liquid diet (broth, tea, or water) as directed by your health care provider. Slowly move to a bland diet as tolerated. SEEK MEDICAL CARE IF:  You have unexplained abdominal pain.  You have abdominal pain associated with nausea or diarrhea.  You have pain when you urinate or have a bowel movement.  You experience abdominal  pain that wakes you in the night.  You have abdominal pain that is worsened or improved by eating food.  You have abdominal pain that is worsened with eating fatty foods.  You have a fever. SEEK IMMEDIATE MEDICAL CARE IF:  Your pain does not go away within 2 hours.  You keep throwing up (vomiting).  Your pain is felt only in portions of the abdomen, such as the right side or the left lower portion of the abdomen.  You pass bloody or black tarry stools. MAKE SURE YOU:  Understand these instructions.  Will watch your condition.  Will get help right away if you are not doing well or get worse.   This information is not intended to replace advice given to you by your health care provider. Make sure you discuss any questions you have with your health care provider.   Document Released: 06/25/2005 Document Revised: 06/06/2015 Document Reviewed: 05/25/2013 Elsevier Interactive Patient Education [employer].

## 2016-03-16 NOTE — ED Notes (Signed)
Pt returned from [place] at this time. Pt verbalized no further needs.

## 2016-03-16 NOTE — ED Notes (Signed)
Patient transported to Ultrasound 

## 2016-03-16 NOTE — ED Notes (Signed)
Pt states pelvic pain has been present since December that comes and goes usually but has been constant for the past 4 days.  Pt has hx of fibroids on uterus dx in Feb 2017 by MRI.  Denies any bleeding or discharge. Pain feels the same as it did since December, but reports the pain is sharper now. Pt c/o pain when sitting and going to the bathroom.

## 2016-03-16 NOTE — ED Provider Notes (Signed)
[facility] Emergency Department Provider Note  ____________________________________________  Time seen: Approximately 1:21 PM  I have reviewed the triage vital signs and the nursing notes.   HISTORY  Chief Complaint Pelvic Pain and Abdominal Pain    HPI ISELLE SIDWELL is a 30 y.o. female with a history of pelvic pain and right lower quadrant abdominal pain and multiple visits to this emergency department, [facility] ED, and occasionally to GYN office for similar symptoms.  She presents today stating that the pelvic pain that has been present for several months intermittently has been constant for the last 4 days.  It was gradual in onset and has been persistent.  She describes it as a severe aching pain in her right lower quadrant same as prior.  She has not had any vaginal bleeding or discharge and states that she has not been sexually active for months.  She states the pain feels the same as back in December.  Nothing makes it better and it is worse with movement.  She feels the pain particularly when she sits and goes to the bathroom but she denies dysuria.  He also denies fever/chills, chest pain, shortness of breath, nausea, vomiting, diarrhea.  When she was last seen in [facility] she had an MRI of her abdomen which showed a couple of fibroids.  She did not her follow up with her GYN doctor.  She previously has had pelvic surgery by Dr. Kenton Kingfisher at [facility].  When she was evaluated last in this emergency department she had ultrasounds, a CT scan, and a pelvic exam, all of which were unremarkable.     History reviewed. No pertinent past medical history.  There are no active problems to display for this patient.   Past Surgical History  Procedure Laterality Date  . Dilitation & currettage/hystroscopy with essure    . Salpingectomy      Current Outpatient Rx  Name  Route  Sig  Dispense  Refill  . cyclobenzaprine (FLEXERIL) 5 MG tablet   Oral  Take 1 tablet (5 mg total) by mouth 3 (three) times daily as needed (muscle pain).   30 tablet   0   . ibuprofen (ADVIL,MOTRIN) 200 MG tablet   Oral   Take 200 mg by mouth every 4 (four) hours as needed.         . naproxen (NAPROSYN) 500 MG tablet      Take 1 po BID with food prn pain   30 tablet   0   . ondansetron (ZOFRAN ODT) 4 MG disintegrating tablet   Oral   Take 1 tablet (4 mg total) by mouth every 8 (eight) hours as needed for nausea or vomiting.   9 tablet   0   . polyethylene glycol (GOLYTELY) 236 g solution   Oral   Take 4,000 mLs by mouth once.   4000 mL   0   . traMADol (ULTRAM) 50 MG tablet   Oral   Take 1 tablet (50 mg total) by mouth every 6 (six) hours as needed.   20 tablet   0     Allergies Vicodin  History reviewed. No pertinent family history.  Social History Social History  Substance Use Topics  . Smoking status: Current Every Day Smoker -- 1.00 packs/day    Types: Cigarettes  . Smokeless tobacco: None  . Alcohol Use: No    Review of Systems Constitutional: No fever/chills Eyes: No visual changes. ENT: No sore throat. Cardiovascular: Denies chest pain.  Respiratory: Denies shortness of breath. Gastrointestinal: +abdominal pain.  No nausea, no vomiting.  No diarrhea.  No constipation. Genitourinary: Negative for dysuria. Musculoskeletal: Negative for back pain. Skin: Negative for rash. Neurological: Negative for headaches, focal weakness or numbness.  10-point ROS otherwise negative.  ____________________________________________   PHYSICAL EXAM:  VITAL SIGNS: ED Triage Vitals  Enc Vitals Group     BP 03/16/16 1205 139/61 mmHg     Pulse Rate 03/16/16 1205 75     Resp 03/16/16 1205 18     Temp 03/16/16 1205 98.1 F (36.7 C)     Temp Source 03/16/16 1205 Oral     SpO2 03/16/16 1205 98 %     Weight 03/16/16 1205 170 lb (77.111 kg)     Height 03/16/16 1205 5\' 3"  (1.6 m)     Head Cir --      Peak Flow --      Pain Score  03/16/16 1208 8     Pain Loc --      Pain Edu? --      Excl. in [place]? --     Constitutional: Alert and oriented. Well appearing and in no acute distress. Eyes: Conjunctivae are normal. PERRL. EOMI. Head: Atraumatic. Nose: No congestion/rhinnorhea. Mouth/Throat: Mucous membranes are moist.  Oropharynx non-erythematous. Neck: No stridor.  No meningeal signs.   Cardiovascular: Normal rate, regular rhythm. Good peripheral circulation. Grossly normal heart sounds.   Respiratory: Normal respiratory effort.  No retractions. Lungs CTAB. Gastrointestinal: Soft with mild generalized tenderness throughout her abdomen Genitourinary:  Deferred at the patient's preference Musculoskeletal: No lower extremity tenderness nor edema. No gross deformities of extremities. Neurologic:  Normal speech and language. No gross focal neurologic deficits are appreciated.  Skin:  Skin is warm, dry and intact. No rash noted. Psychiatric: Mood and affect are normal. Speech and behavior are normal.  ____________________________________________   LABS (all labs ordered are listed, but only abnormal results are displayed)  Labs Reviewed  COMPREHENSIVE METABOLIC PANEL - Abnormal; Notable for the following:    Glucose, Bld 140 (*)    All other components within normal limits  CBC - Abnormal; Notable for the following:    WBC 11.4 (*)    All other components within normal limits  URINALYSIS COMPLETEWITH MICROSCOPIC ([facility] ONLY) - Abnormal; Notable for the following:    Color, Urine YELLOW (*)    APPearance HAZY (*)    Squamous Epithelial / LPF 0-5 (*)    All other components within normal limits  LIPASE, BLOOD  POC URINE PREG, ED  POCT PREGNANCY, URINE   ____________________________________________  EKG  None ____________________________________________  RADIOLOGY   US Transvaginal Non-ob  03/16/2016  CLINICAL DATA:  Right lower quadrant abdominal pain. History of fibroids. Post endometrial ablation.  EXAM: TRANSABDOMINAL AND TRANSVAGINAL ULTRASOUND OF PELVIS DOPPLER ULTRASOUND OF OVARIES TECHNIQUE: Both transabdominal and transvaginal ultrasound examinations of the pelvis were performed. Transabdominal technique was performed for global imaging of the pelvis including uterus, ovaries, adnexal regions, and pelvic cul-de-sac. It was necessary to proceed with endovaginal exam following the transabdominal exam to visualize the endometrium. Color and duplex Doppler ultrasound was utilized to evaluate blood flow to the ovaries. COMPARISON:  09/24/2015 FINDINGS: Uterus Measurements: 6.7 x 3.3 x 4.6 cm. There is a complex cystic mass within the uterine body measuring 1.6 x 1.5 x 1.8 cm. Second complex cystic mass is seen in the lower posterior uterine segment measuring 1.1 x 0.7 x 0.9 cm. No internal blood flow is identified. It  is difficult to determine whether these are of myometrial or endometrial origin, as no well-defined endometrium is seen. Patient is status post endometrial ablation and this may be a postprocedural finding. Right ovary Measurements: 3.0 x 1.9 x 3.6 cm. Normal appearance/no adnexal mass. Left ovary Measurements: 1.7 x 2.0 x 3.0 cm. Normal appearance/no adnexal mass. Pulsed Doppler evaluation of both ovaries demonstrates normal low-resistance arterial and venous waveforms. Other findings There is trace of free fluid within the cervix. No free intraperitoneal fluid. IMPRESSION: Normal sized uterus with 2 complex cystic masses within the uterine fundus and lower uterine segment. It is difficult to determine whether these are of myometrial or endometrial origin, as no well-defined endometrium is seen. No internal blood flow noted. Favor benign etiology. Normal appearance of the ovaries. Trace amount of free fluid within the cervix. Electronically Signed   By: Fidela Salisbury M.D.   On: 03/16/2016 15:26   US Pelvis Complete  03/16/2016  CLINICAL DATA:  Right lower quadrant abdominal pain.  History of fibroids. Post endometrial ablation. EXAM: TRANSABDOMINAL AND TRANSVAGINAL ULTRASOUND OF PELVIS DOPPLER ULTRASOUND OF OVARIES TECHNIQUE: Both transabdominal and transvaginal ultrasound examinations of the pelvis were performed. Transabdominal technique was performed for global imaging of the pelvis including uterus, ovaries, adnexal regions, and pelvic cul-de-sac. It was necessary to proceed with endovaginal exam following the transabdominal exam to visualize the endometrium. Color and duplex Doppler ultrasound was utilized to evaluate blood flow to the ovaries. COMPARISON:  09/24/2015 FINDINGS: Uterus Measurements: 6.7 x 3.3 x 4.6 cm. There is a complex cystic mass within the uterine body measuring 1.6 x 1.5 x 1.8 cm. Second complex cystic mass is seen in the lower posterior uterine segment measuring 1.1 x 0.7 x 0.9 cm. No internal blood flow is identified. It is difficult to determine whether these are of myometrial or endometrial origin, as no well-defined endometrium is seen. Patient is status post endometrial ablation and this may be a postprocedural finding. Right ovary Measurements: 3.0 x 1.9 x 3.6 cm. Normal appearance/no adnexal mass. Left ovary Measurements: 1.7 x 2.0 x 3.0 cm. Normal appearance/no adnexal mass. Pulsed Doppler evaluation of both ovaries demonstrates normal low-resistance arterial and venous waveforms. Other findings There is trace of free fluid within the cervix. No free intraperitoneal fluid. IMPRESSION: Normal sized uterus with 2 complex cystic masses within the uterine fundus and lower uterine segment. It is difficult to determine whether these are of myometrial or endometrial origin, as no well-defined endometrium is seen. No internal blood flow noted. Favor benign etiology. Normal appearance of the ovaries. Trace amount of free fluid within the cervix. Electronically Signed   By: Fidela Salisbury M.D.   On: 03/16/2016 15:26   [place] Art/ven Flow Abd Pelv  Doppler  03/16/2016  CLINICAL DATA:  Right lower quadrant abdominal pain. History of fibroids. Post endometrial ablation. EXAM: TRANSABDOMINAL AND TRANSVAGINAL ULTRASOUND OF PELVIS DOPPLER ULTRASOUND OF OVARIES TECHNIQUE: Both transabdominal and transvaginal ultrasound examinations of the pelvis were performed. Transabdominal technique was performed for global imaging of the pelvis including uterus, ovaries, adnexal regions, and pelvic cul-de-sac. It was necessary to proceed with endovaginal exam following the transabdominal exam to visualize the endometrium. Color and duplex Doppler ultrasound was utilized to evaluate blood flow to the ovaries. COMPARISON:  09/24/2015 FINDINGS: Uterus Measurements: 6.7 x 3.3 x 4.6 cm. There is a complex cystic mass within the uterine body measuring 1.6 x 1.5 x 1.8 cm. Second complex cystic mass is seen in the lower posterior uterine segment measuring  1.1 x 0.7 x 0.9 cm. No internal blood flow is identified. It is difficult to determine whether these are of myometrial or endometrial origin, as no well-defined endometrium is seen. Patient is status post endometrial ablation and this may be a postprocedural finding. Right ovary Measurements: 3.0 x 1.9 x 3.6 cm. Normal appearance/no adnexal mass. Left ovary Measurements: 1.7 x 2.0 x 3.0 cm. Normal appearance/no adnexal mass. Pulsed Doppler evaluation of both ovaries demonstrates normal low-resistance arterial and venous waveforms. Other findings There is trace of free fluid within the cervix. No free intraperitoneal fluid. IMPRESSION: Normal sized uterus with 2 complex cystic masses within the uterine fundus and lower uterine segment. It is difficult to determine whether these are of myometrial or endometrial origin, as no well-defined endometrium is seen. No internal blood flow noted. Favor benign etiology. Normal appearance of the ovaries. Trace amount of free fluid within the cervix. Electronically Signed   By: Fidela Salisbury  M.D.   On: 03/16/2016 15:26    ____________________________________________   PROCEDURES  Procedure(s) performed:   Procedures   ____________________________________________   INITIAL IMPRESSION / ASSESSMENT AND PLAN / ED COURSE  Pertinent labs & imaging results that were available during my care of the patient were reviewed by me and considered in my medical decision making (see chart for details).  No leukocytosis, normal vital signs, afebrile.  Presentation is very similar to prior.  There is nothing about today's presentation that makes me concerned for appendicitis and I think the risk of repeated CT scans is much greater than proceeding with a pelvic workup including ultrasound.  The patient would prefer not to have another pelvic exam which I think is appropriate.  I am awaiting ultrasounds including to rule out torsion.  ----------------------------------------- 5:55 PM on 03/16/2016 -----------------------------------------  Nonemergent ultrasound results.  Small amount of pelvic free fluid, most likely from a ruptured ovarian cyst.  The patient has been comfortable, tolerating by mouth intake, no additional reports the pain.  I had my usual customary discussion about minimal pelvic free fluid, ovarian cysts, and outpatient follow-up with GYN.  She understands and agrees with plan.  ____________________________________________  FINAL CLINICAL IMPRESSION(S) / ED DIAGNOSES  Final diagnoses:  RLQ abdominal pain  Generalized abdominal pain     MEDICATIONS GIVEN DURING THIS VISIT:  Medications  ketorolac (TORADOL) 30 MG/ML injection 15 mg (15 mg Intramuscular Given 03/16/16 1334)     NEW OUTPATIENT MEDICATIONS STARTED DURING THIS VISIT:  New Prescriptions   No medications on file      Note:  This document was prepared using Dragon voice recognition software and may include unintentional dictation errors.   Hinda Kehr, MD 03/16/16 1755

## 2016-04-14 ENCOUNTER — Encounter
Admission: RE | Admit: 2016-04-14 | Discharge: 2016-04-14 | Disposition: A | Discharge status: Home / Self Care | Payer: PRIVATE HEALTH INSURANCE | Source: Ambulatory Visit | Attending: Obstetrics & Gynecology | Admitting: Obstetrics & Gynecology

## 2016-04-14 DIAGNOSIS — Z01812 Encounter for preprocedural laboratory examination: Secondary | ICD-10-CM | POA: Diagnosis not present

## 2016-04-14 HISTORY — DX: Headache, unspecified: R51.9

## 2016-04-14 HISTORY — DX: Headache: R51

## 2016-04-14 LAB — CBC
HEMATOCRIT: 40.8 % (ref 35.0–47.0)
Hemoglobin: 14 g/dL (ref 12.0–16.0)
MCH: 31.8 pg (ref 26.0–34.0)
MCHC: 34.2 g/dL (ref 32.0–36.0)
MCV: 92.9 fL (ref 80.0–100.0)
PLATELETS: 285 10*3/uL (ref 150–440)
RBC: 4.39 MIL/uL (ref 3.80–5.20)
RDW: 13.3 % (ref 11.5–14.5)
WBC: 12.1 10*3/uL — AB (ref 3.6–11.0)

## 2016-04-14 LAB — SURGICAL PCR SCREEN
MRSA, PCR: NEGATIVE
STAPHYLOCOCCUS AUREUS: NEGATIVE

## 2016-04-14 LAB — TYPE AND SCREEN
ABO/RH(D): A NEG
ANTIBODY SCREEN: NEGATIVE

## 2016-04-14 NOTE — Patient Instructions (Signed)
  Your procedure is scheduled [id number] 27, 2017 (Thursday) Report to Same Day Surgery 2nd floor [facility]  To find out your arrival time please call [phone] between 1PM - 3PM on April 23, 2016 (Wednesday)  Remember: Instructions that are not followed completely may result in serious medical risk, up to and including death, or upon the discretion of your surgeon and anesthesiologist your surgery may need to be rescheduled.    _x___ 1. Do not eat food or drink liquids after midnight. No gum chewing or hard candies.     _x___ 2. No Alcohol for 24 hours before or after surgery.   _x___3. No Smoking for 24 prior to surgery.   ____  4. Bring all medications with you on the day of surgery if instructed.    __x__ 5. Notify your doctor if there is any change in your medical condition     (cold, fever, infections).     Do not wear jewelry, make-up, hairpins, clips or nail polish.  Do not wear lotions, powders, or perfumes. You may wear deodorant.  Do not shave 48 hours prior to surgery. Men may shave face and neck.  Do not bring valuables to the hospital.    [facility] is not responsible for any belongings or valuables.               Contacts, dentures or bridgework may not be worn into surgery.  Leave your suitcase in the car. After surgery it may be brought to your room.  For patients admitted to the hospital, discharge time is determined by your treatment team.   Patients discharged the day of surgery will not be allowed to drive home.    Please read over the following fact sheets that you were given:   [facility] Preparing for Surgery and or MRSA Information   _x___ Take these medicines the morning of surgery with A SIP OF WATER:    1.   2.  3.  4.  5.  6.  ____ Fleet Enema (as directed)   _x___ Use CHG Soap or sage wipes as directed on instruction sheet   ____ Use inhalers on the day of surgery and bring to hospital day of surgery  ____ Stop metformin 2 days prior  to surgery    ____ Take 1/2 of usual insulin dose the night before surgery and none on the morning of  surgery         _x___ Stop aspirin or coumadin, or plavix (NO ASPIRIN)  _x__ Stop Anti-inflammatories such as Advil, Aleve, Ibuprofen, Motrin, Naproxen,          Naprosyn, Goodies powders or aspirin products. Ok to take Tylenol.   ____ Stop supplements until after surgery.    ____ Bring C-Pap to the hospital.

## 2016-04-15 NOTE — Pre-Procedure Instructions (Signed)
CBC results sent to Dr. Kenton Kingfisher for review.

## 2016-04-24 ENCOUNTER — Ambulatory Visit: Payer: PRIVATE HEALTH INSURANCE | Admitting: Anesthesiology

## 2016-04-24 ENCOUNTER — Ambulatory Visit
Admission: RE | Admit: 2016-04-24 | Discharge: 2016-04-24 | Disposition: A | Discharge status: Home / Self Care | Payer: PRIVATE HEALTH INSURANCE | Source: Ambulatory Visit | Attending: Obstetrics & Gynecology | Admitting: Obstetrics & Gynecology

## 2016-04-24 ENCOUNTER — Encounter: Payer: Self-pay | Admitting: *Deleted

## 2016-04-24 ENCOUNTER — Encounter
Admission: RE | Disposition: A | Discharge status: Home / Self Care | Payer: Self-pay | Source: Ambulatory Visit | Attending: Obstetrics & Gynecology

## 2016-04-24 DIAGNOSIS — Z8249 Family history of ischemic heart disease and other diseases of the circulatory system: Secondary | ICD-10-CM | POA: Insufficient documentation

## 2016-04-24 DIAGNOSIS — Z885 Allergy status to narcotic agent status: Secondary | ICD-10-CM | POA: Insufficient documentation

## 2016-04-24 DIAGNOSIS — Z833 Family history of diabetes mellitus: Secondary | ICD-10-CM | POA: Diagnosis not present

## 2016-04-24 DIAGNOSIS — G8929 Other chronic pain: Secondary | ICD-10-CM | POA: Diagnosis present

## 2016-04-24 DIAGNOSIS — N8311 Corpus luteum cyst of right ovary: Secondary | ICD-10-CM | POA: Insufficient documentation

## 2016-04-24 DIAGNOSIS — E669 Obesity, unspecified: Secondary | ICD-10-CM | POA: Insufficient documentation

## 2016-04-24 DIAGNOSIS — F17200 Nicotine dependence, unspecified, uncomplicated: Secondary | ICD-10-CM | POA: Insufficient documentation

## 2016-04-24 DIAGNOSIS — D259 Leiomyoma of uterus, unspecified: Secondary | ICD-10-CM | POA: Insufficient documentation

## 2016-04-24 DIAGNOSIS — N800 Endometriosis of uterus: Secondary | ICD-10-CM | POA: Diagnosis not present

## 2016-04-24 DIAGNOSIS — R102 Pelvic and perineal pain: Secondary | ICD-10-CM | POA: Diagnosis not present

## 2016-04-24 HISTORY — PX: CYSTOSCOPY: SHX5120

## 2016-04-24 HISTORY — PX: LAPAROSCOPIC OVARIAN CYSTECTOMY: SHX6248

## 2016-04-24 HISTORY — PX: LAPAROSCOPIC HYSTERECTOMY: SHX1926

## 2016-04-24 LAB — POCT PREGNANCY, URINE: PREG TEST UR: NEGATIVE

## 2016-04-24 SURGERY — HYSTERECTOMY, TOTAL, LAPAROSCOPIC
Anesthesia: General | Laterality: Right

## 2016-04-24 MED ORDER — FAMOTIDINE 20 MG PO TABS
20.0000 mg | ORAL_TABLET | Freq: Once | ORAL | Status: AC
  Administered 2016-04-24: 20 mg via ORAL

## 2016-04-24 MED ORDER — FENTANYL CITRATE (PF) 100 MCG/2ML IJ SOLN
INTRAMUSCULAR | Status: AC
  Filled 2016-04-24: qty 2

## 2016-04-24 MED ORDER — ACETAMINOPHEN 650 MG RE SUPP
650.0000 mg | RECTAL | Status: DC | PRN
  Filled 2016-04-24: qty 1

## 2016-04-24 MED ORDER — OXYCODONE-ACETAMINOPHEN 5-325 MG PO TABS: 1.0000 | ORAL_TABLET | ORAL | 0 refills | Status: DC | PRN

## 2016-04-24 MED ORDER — PROMETHAZINE HCL 25 MG/ML IJ SOLN
INTRAMUSCULAR | Status: AC
  Administered 2016-04-24: 12.5 mg via INTRAVENOUS
  Filled 2016-04-24: qty 1

## 2016-04-24 MED ORDER — LACTATED RINGERS IV SOLN
INTRAVENOUS | Status: DC
  Administered 2016-04-24 (×3): via INTRAVENOUS

## 2016-04-24 MED ORDER — FENTANYL CITRATE (PF) 100 MCG/2ML IJ SOLN
INTRAMUSCULAR | Status: DC | PRN
  Administered 2016-04-24: 100 ug via INTRAVENOUS
  Administered 2016-04-24 (×2): 50 ug via INTRAVENOUS

## 2016-04-24 MED ORDER — ROCURONIUM BROMIDE 100 MG/10ML IV SOLN
INTRAVENOUS | Status: DC | PRN
  Administered 2016-04-24: 10 mg via INTRAVENOUS
  Administered 2016-04-24: 40 mg via INTRAVENOUS

## 2016-04-24 MED ORDER — SODIUM CHLORIDE 0.9 % IJ SOLN
INTRAMUSCULAR | Status: AC
  Filled 2016-04-24: qty 20

## 2016-04-24 MED ORDER — KETOROLAC TROMETHAMINE 30 MG/ML IJ SOLN
30.0000 mg | Freq: Four times a day (QID) | INTRAMUSCULAR | Status: DC
  Filled 2016-04-24: qty 1

## 2016-04-24 MED ORDER — OXYCODONE HCL 5 MG/5ML PO SOLN: 5.0000 mg | Freq: Once | ORAL | Status: DC | PRN

## 2016-04-24 MED ORDER — PROPOFOL 10 MG/ML IV BOLUS
INTRAVENOUS | Status: DC | PRN
  Administered 2016-04-24: 175 mg via INTRAVENOUS

## 2016-04-24 MED ORDER — MORPHINE SULFATE (PF) 2 MG/ML IV SOLN: 1.0000 mg | INTRAVENOUS | Status: DC | PRN

## 2016-04-24 MED ORDER — FENTANYL CITRATE (PF) 100 MCG/2ML IJ SOLN
25.0000 ug | INTRAMUSCULAR | Status: DC | PRN
  Administered 2016-04-24 (×3): 50 ug via INTRAVENOUS

## 2016-04-24 MED ORDER — OXYCODONE HCL 5 MG PO TABS
5.0000 mg | ORAL_TABLET | Freq: Once | ORAL | Status: DC | PRN
Start: 2016-04-24 — End: 2016-04-24

## 2016-04-24 MED ORDER — NEOSTIGMINE METHYLSULFATE 10 MG/10ML IV SOLN
INTRAVENOUS | Status: DC | PRN
  Administered 2016-04-24: 4 mg via INTRAVENOUS

## 2016-04-24 MED ORDER — KETOROLAC TROMETHAMINE 30 MG/ML IJ SOLN
INTRAMUSCULAR | Status: DC | PRN
  Administered 2016-04-24: 30 mg via INTRAVENOUS

## 2016-04-24 MED ORDER — BUPIVACAINE HCL (PF) 0.5 % IJ SOLN
INTRAMUSCULAR | Status: DC | PRN
Start: 2016-04-24 — End: 2016-04-24
  Administered 2016-04-24: 15 mL

## 2016-04-24 MED ORDER — LIDOCAINE HCL (CARDIAC) 20 MG/ML IV SOLN
INTRAVENOUS | Status: DC | PRN
  Administered 2016-04-24: 30 mg via INTRAVENOUS

## 2016-04-24 MED ORDER — MEPERIDINE HCL 25 MG/ML IJ SOLN: 6.2500 mg | INTRAMUSCULAR | Status: DC | PRN

## 2016-04-24 MED ORDER — ONDANSETRON HCL 4 MG/2ML IJ SOLN
INTRAMUSCULAR | Status: AC
  Administered 2016-04-24: 4 mg
  Filled 2016-04-24: qty 2

## 2016-04-24 MED ORDER — ACETAMINOPHEN 325 MG PO TABS: 650.0000 mg | ORAL_TABLET | ORAL | Status: DC | PRN

## 2016-04-24 MED ORDER — GLYCOPYRROLATE 0.2 MG/ML IJ SOLN
INTRAMUSCULAR | Status: DC | PRN
  Administered 2016-04-24: .8 mg via INTRAVENOUS

## 2016-04-24 MED ORDER — PROMETHAZINE HCL 25 MG/ML IJ SOLN
6.2500 mg | INTRAMUSCULAR | Status: DC | PRN
  Administered 2016-04-24: 12.5 mg via INTRAVENOUS

## 2016-04-24 MED ORDER — BUPIVACAINE HCL (PF) 0.5 % IJ SOLN
INTRAMUSCULAR | Status: AC
  Filled 2016-04-24: qty 30

## 2016-04-24 MED ORDER — DEXAMETHASONE SODIUM PHOSPHATE 10 MG/ML IJ SOLN
INTRAMUSCULAR | Status: DC | PRN
  Administered 2016-04-24: 5 mg via INTRAVENOUS

## 2016-04-24 MED ORDER — CEFOXITIN SODIUM-DEXTROSE 2-2.2 GM-% IV SOLR (PREMIX)
2.0000 g | Freq: Once | INTRAVENOUS | Status: AC
  Administered 2016-04-24: 2000 mg via INTRAVENOUS

## 2016-04-24 MED ORDER — MIDAZOLAM HCL 2 MG/2ML IJ SOLN
INTRAMUSCULAR | Status: DC | PRN
  Administered 2016-04-24: 2 mg via INTRAVENOUS

## 2016-04-24 MED ORDER — ACETAMINOPHEN 10 MG/ML IV SOLN
INTRAVENOUS | Status: DC | PRN
  Administered 2016-04-24: 1000 mg via INTRAVENOUS

## 2016-04-24 MED ORDER — ONDANSETRON HCL 4 MG/2ML IJ SOLN
INTRAMUSCULAR | Status: DC | PRN
  Administered 2016-04-24: 4 mg via INTRAVENOUS

## 2016-04-24 SURGICAL SUPPLY — 49 items
BAG URO DRAIN 2000ML W/SPOUT (MISCELLANEOUS) ×5 IMPLANT
BLADE SURG SZ11 CARB STEEL (BLADE) ×5 IMPLANT
CANISTER SUCT 1200ML W/VALVE (MISCELLANEOUS) ×5 IMPLANT
CATH FOLEY 2WAY  5CC 16FR (CATHETERS) ×2
CATH URTH 16FR FL 2W BLN LF (CATHETERS) ×3 IMPLANT
CHLORAPREP W/TINT 26ML (MISCELLANEOUS) ×5 IMPLANT
COVER LIGHT HANDLE STERIS (MISCELLANEOUS) ×5 IMPLANT
DEFOGGER SCOPE WARMER CLEARIFY (MISCELLANEOUS) ×5 IMPLANT
DEVICE SUTURE ENDOST 10MM (ENDOMECHANICALS) ×5 IMPLANT
DRAPE CAMERA CLOSED 9X96 (DRAPES) ×5 IMPLANT
DRESSING TELFA 4X3 1S ST N-ADH (GAUZE/BANDAGES/DRESSINGS) ×5 IMPLANT
DRSG TEGADERM 2-3/8X2-3/4 SM (GAUZE/BANDAGES/DRESSINGS) IMPLANT
ENDOSTITCH 0 SINGLE 48 (SUTURE) ×25 IMPLANT
GAUZE SPONGE NON-WVN 2X2 STRL (MISCELLANEOUS) IMPLANT
GLOVE BIO SURGEON STRL SZ8 (GLOVE) ×25 IMPLANT
GLOVE INDICATOR 8.0 STRL GRN (GLOVE) ×5 IMPLANT
GOWN STRL REUS W/ TWL LRG LVL3 (GOWN DISPOSABLE) ×3 IMPLANT
GOWN STRL REUS W/ TWL XL LVL3 (GOWN DISPOSABLE) ×6 IMPLANT
GOWN STRL REUS W/TWL LRG LVL3 (GOWN DISPOSABLE) ×2
GOWN STRL REUS W/TWL XL LVL3 (GOWN DISPOSABLE) ×4
IRRIGATION STRYKERFLOW (MISCELLANEOUS) ×3 IMPLANT
IRRIGATOR STRYKERFLOW (MISCELLANEOUS) ×5
IV LACTATED RINGERS 1000ML (IV SOLUTION) ×5 IMPLANT
KIT PINK PAD W/HEAD ARE REST (MISCELLANEOUS) ×5
KIT PINK PAD W/HEAD ARM REST (MISCELLANEOUS) ×3 IMPLANT
KIT RM TURNOVER CYSTO AR (KITS) ×5 IMPLANT
LABEL OR SOLS (LABEL) ×5 IMPLANT
LIQUID BAND (GAUZE/BANDAGES/DRESSINGS) ×5 IMPLANT
MANIPULATOR VCARE LG CRV RETR (MISCELLANEOUS) IMPLANT
MANIPULATOR VCARE SML CRV RETR (MISCELLANEOUS) ×5 IMPLANT
MANIPULATOR VCARE STD CRV RETR (MISCELLANEOUS) IMPLANT
NEEDLE VERESS 14GA 120MM (NEEDLE) ×5 IMPLANT
NS IRRIG 500ML POUR BTL (IV SOLUTION) ×5 IMPLANT
OCCLUDER COLPOPNEUMO (BALLOONS) ×5 IMPLANT
PACK GYN LAPAROSCOPIC (MISCELLANEOUS) ×5 IMPLANT
PAD OB MATERNITY 4.3X12.25 (PERSONAL CARE ITEMS) ×5 IMPLANT
PAD PREP 24X41 OB/GYN DISP (PERSONAL CARE ITEMS) ×5 IMPLANT
SCISSORS METZENBAUM CVD 33 (INSTRUMENTS) ×5 IMPLANT
SET CYSTO W/LG BORE CLAMP LF (SET/KITS/TRAYS/PACK) ×5 IMPLANT
SHEARS HARMONIC ACE PLUS 36CM (ENDOMECHANICALS) ×5 IMPLANT
SLEEVE ENDOPATH XCEL 5M (ENDOMECHANICALS) ×5 IMPLANT
SPONGE LAP 18X18 5 PK (GAUZE/BANDAGES/DRESSINGS) ×5 IMPLANT
SPONGE VERSALON 2X2 STRL (MISCELLANEOUS)
SUT VIC AB 2-0 UR6 27 (SUTURE) IMPLANT
SYR 50ML LL SCALE MARK (SYRINGE) ×5 IMPLANT
SYRINGE 10CC LL (SYRINGE) ×5 IMPLANT
TROCAR ENDO BLADELESS 11MM (ENDOMECHANICALS) ×5 IMPLANT
TROCAR XCEL NON-BLD 5MMX100MML (ENDOMECHANICALS) ×5 IMPLANT
TUBING INSUFFLATOR HEATED (MISCELLANEOUS) ×5 IMPLANT

## 2016-04-24 NOTE — Op Note (Signed)
Operative Note:  PRE-OP DIAGNOSIS: chronic pelvic pain,endometriosis   POST-OP DIAGNOSIS: chronic pelvic pain,endometriosis   PROCEDURE: Procedure(s): HYSTERECTOMY TOTAL LAPAROSCOPIC / BILATERAL SALPINGECTOMY LAPAROSCOPIC OVARIAN CYSTECTOMY CYSTOSCOPY  SURGEON: Barnett Applebaum, MD, FACOG  ASSISTANT: Dr Glennon Mac   ANESTHESIA: General endotracheal anesthesia  ESTIMATED BLOOD LOSS: 100 mL  SPECIMENS: Uterus, Tubes, Ovarian Cyst Wall (RIGHT)  COMPLICATIONS: None  DISPOSITION: stable to PACU  FINDINGS: Intraabdominal adhesions were noted. Adnexa especially right sided adhesions.  Simple appearing cyst of right ovary (no signs of endometrioma).  PROCEDURE:  The patient was taken to the OR where anesthesia was administed. She was prepped and draped in the normal sterile fashion in the dorsal lithotomy position in the [place] stirrups. A time out was performed. A Graves speculum was inserted, the cervix was grasped with a single tooth tenaculum and the endometrial cavity was sounded. The cervix was progressively dilated to a size 14 [place] with [employer] dilators. A V-Care uterine manipulator was inserted in the usual fashion without incident. Gloves were changed and attention was turned to the abdomen.   An infraumbilical transverse 23mm skin incision was made with the scalpel after local anesthesia applied to the skin. A Veress-step needle was inserted in the usual fashion and confirmed using the hanging drop technique. A pneumoperitoneum was obtained by insufflation of CO2 (opening pressure of 51mmHg) to 66mmHg. A diagnostic laparoscopy was performed yielding the previously described findings. Attention was turned to the left lower quadrant where after visualization of the inferior epigastric vessels a 49mm skin incision was made with the scalpel. A 5 mm laparoscopic port was inserted. The same procedure was repeated in the right lower quadrant with a 22mm trocar. Attention was turned to the left aspect  of the uterus, where after visualization of the ureter, the round ligament was coagulated and transected using the 42mm Harmonic Scapel. The anterior and posterior leafs of the broad ligament were dissected off as the anterior one was coagulated and transected in a caudal direction towards the cuff of the uterine manipulator. The vesicouterine reflection of the peritoneum was dissected with the harmonic scapel and the bladder flap was created bluntly. Attention was then turned to the left fallopian tube which was recognized by visualization of the fimbria. First the mesosalpinx and then the utero-ovarian ligament were coagulated and transected using the Harmonic Scapel. Attention was turned to the right aspect of the uterus where the same procedure was performed. The uterine vessels were sealed and transected bilaterally using first bipolar cautery and then the harmonic scapel. A 360 degree, circumferential colpotomy was done to completely amputate the uterus with cervix and tubes. Once the specimen was amputated it was delivered through the vagina.   The colpotomy was repaired in a simple interrupted ashion using a 0-Polysorb suture with an endo-stitch device.  Vaginal exam confirms complete closure.  The cavity was copiously irrigated. A survey of the pelvic cavity revealed adequate hemostasis and no injury to bowel, bladder, or ureter.  The right ovarian cyst was carefully dissected and aspirated with removal of inner cyst wall.  One suture using the endostitch device w polysorb suture is placed over ovary to assist with hemostasis.  A diagnostic cystoscopy was performed using saline distension of bladder, with bilateral urine flow from each ureteral orifice.  No bladder lesions or injuries seen.  Foley cathater replaced.   At this point the procedure was finalized. All the instruments were removed from the patient's body. Gas was expelled and patient is leveled.  Incisions are  closed with skin adhesive.     Patient goes to recovery room in stable condition.  All sponge, instrument, and needle counts are correct x2.

## 2016-04-24 NOTE — Discharge Instructions (Signed)
AMBULATORY SURGERY  DISCHARGE INSTRUCTIONS   1) The drugs that you were given will stay in your system until tomorrow so for the next 24 hours you should not:  A) Drive an automobile B) Make any legal decisions C) Drink any alcoholic beverage   2) You may resume regular meals tomorrow.  Today it is better to start with liquids and gradually work up to solid foods.  You may eat anything you prefer, but it is better to start with liquids, then soup and crackers, and gradually work up to solid foods.   3) Please notify your doctor immediately if you have any unusual bleeding, trouble breathing, redness and pain at the surgery site, drainage, fever, or pain not relieved by medication.    4) Additional Instructions:        Please contact your physician with any problems or Same Day Surgery at [phone], Monday through Friday 6 am to 4 pm, or [place] at [facility] number at [phone].Total Laparoscopic Hysterectomy, Care After Refer to this sheet in the next few weeks. These instructions provide you with information on caring for yourself after your procedure. Your health care provider may also give you more specific instructions. Your treatment has been planned according to current medical practices, but problems sometimes occur. Call your health care provider if you have any problems or questions after your procedure. WHAT TO EXPECT AFTER THE PROCEDURE  Pain and bruising at the incision sites. You will be given pain medicine to control it.  Menopausal symptoms such as hot flashes, night sweats, and insomnia if your ovaries were removed.  Sore throat from the breathing tube that was inserted during surgery. HOME CARE INSTRUCTIONS  Only take over-the-counter or prescription medicines for pain, discomfort, or fever as directed by your health care provider.   Do not take aspirin. It can cause bleeding.   Do not drive when taking pain medicine.   Follow your health  care provider's advice regarding diet, exercise, lifting, driving, and general activities.   Resume your usual diet as directed and allowed.   Get plenty of rest and sleep.   Do not douche, use tampons, or have sexual intercourse for at least 6 weeks, or until your health care provider gives you permission.   Change your bandages (dressings) as directed by your health care provider.   Monitor your temperature and notify your health care provider of a fever.   Take showers instead of baths for 2-3 weeks.   Do not drink alcohol until your health care provider gives you permission.   If you develop constipation, you may take a mild laxative with your health care provider's permission. Bran foods may help with constipation problems. Drinking enough fluids to keep your urine clear or pale yellow may help as well.   Try to have someone home with you for 1-2 weeks to help around the house.   Keep all of your follow-up appointments as directed by your health care provider.  SEEK MEDICAL CARE IF:  You have swelling, redness, or increasing pain around your incision sites.   You have pus coming from your incision.   You notice a bad smell coming from your incision.   Your incision breaks open.   You feel dizzy or lightheaded.   You have pain or bleeding when you urinate.   You have persistent diarrhea.   You have persistent nausea and vomiting.   You have abnormal vaginal discharge.   You have a rash.  You have any type of abnormal reaction or develop an allergy to your medicine.   You have poor pain control with your prescribed medicine.  SEEK IMMEDIATE MEDICAL CARE IF:  You have chest pain or shortness of breath.  You have severe abdominal pain that is not relieved with pain medicine.  You have pain or swelling in your legs. MAKE SURE YOU:  Understand these instructions.  Will watch your condition.  Will get help right away if you are not  doing well or get worse.   This information is not intended to replace advice given to you by your health care provider. Make sure you discuss any questions you have with your health care provider.   Document Released: 07/06/2013 Document Revised: 09/20/2013 Document Reviewed: 07/06/2013 Elsevier Interactive Patient Education [employer].

## 2016-04-24 NOTE — Transfer of Care (Signed)
Immediate Anesthesia Transfer of Care Note  Patient: Sylvia George  Procedure(s) Performed: Procedure(s): HYSTERECTOMY TOTAL LAPAROSCOPIC / BILATERAL SALPINGECTOMY (Bilateral) LAPAROSCOPIC OVARIAN CYSTECTOMY (Right) CYSTOSCOPY  Patient Location: PACU  Anesthesia Type:General  Level of Consciousness: awake, alert  and oriented  Airway & Oxygen Therapy: Patient Spontanous Breathing and Patient connected to face mask oxygen  Post-op Assessment: Report given to RN and Post -op Vital signs reviewed and stable  Post vital signs: Reviewed and stable  Last Vitals:  Vitals:   04/24/16 1157  BP: 136/75  Pulse: 93  Resp: 18  Temp: 36.6 C    Last Pain:  Vitals:   04/24/16 1157  PainSc: Asleep         Complications: No apparent anesthesia complications

## 2016-04-24 NOTE — Anesthesia Procedure Notes (Signed)
Procedure Name: Intubation Performed by: Presly Steinruck Pre-anesthesia Checklist: Patient identified, Patient being monitored, Timeout performed, Emergency Drugs available and Suction available Patient Re-evaluated:Patient Re-evaluated prior to inductionOxygen Delivery Method: Circle system utilized Preoxygenation: Pre-oxygenation with 100% oxygen Intubation Type: IV induction Ventilation: Mask ventilation without difficulty Laryngoscope Size: Mac and 3 Grade View: Grade I Tube type: Oral Tube size: 7.0 mm Number of attempts: 1 Airway Equipment and Method: Stylet Placement Confirmation: ETT inserted through vocal cords under direct vision,  positive ETCO2 and breath sounds checked- equal and bilateral Secured at: 21 cm Tube secured with: Tape Dental Injury: Teeth and Oropharynx as per pre-operative assessment        

## 2016-04-24 NOTE — Anesthesia Postprocedure Evaluation (Signed)
Anesthesia Post Note  Patient: MIRRIAM KINGSBERRY  Procedure(s) Performed: Procedure(s) (LRB): HYSTERECTOMY TOTAL LAPAROSCOPIC / BILATERAL SALPINGECTOMY (Bilateral) LAPAROSCOPIC OVARIAN CYSTECTOMY (Right) CYSTOSCOPY  Patient location during evaluation: PACU Anesthesia Type: General Level of consciousness: awake and alert and oriented Pain management: pain level controlled Vital Signs Assessment: post-procedure vital signs reviewed and stable Respiratory status: spontaneous breathing, nonlabored ventilation and respiratory function stable Cardiovascular status: blood pressure returned to baseline and stable Postop Assessment: no signs of nausea or vomiting Anesthetic complications: no    Last Vitals:  Vitals:   04/24/16 1226 04/24/16 1232  BP: 125/70   Pulse: 62 83  Resp: 15 12  Temp:      Last Pain:  Vitals:   04/24/16 1232  PainSc: 9                  Reem Fleury

## 2016-04-24 NOTE — Anesthesia Preprocedure Evaluation (Signed)
Anesthesia Evaluation  Patient identified by MRN, date of birth, ID band Patient awake    Reviewed: Allergy & Precautions, NPO status , Patient's Chart, lab work & pertinent test results  History of Anesthesia Complications Negative for: history of anesthetic complications  Airway Mallampati: II  TM Distance: >3 FB Neck ROM: Full    Dental no notable dental hx.    Pulmonary neg COPD, Current Smoker,    breath sounds clear to auscultation- rhonchi (-) wheezing      Cardiovascular Exercise Tolerance: Good (-) hypertension(-) CAD and (-) Past MI  Rhythm:Regular Rate:Normal - Systolic murmurs and - Diastolic murmurs    Neuro/Psych  Headaches, negative psych ROS   GI/Hepatic negative GI ROS, Neg liver ROS,   Endo/Other  negative endocrine ROSneg diabetes  Renal/GU negative Renal ROS     Musculoskeletal negative musculoskeletal ROS (+)   Abdominal (+) + obese,   Peds  Hematology negative hematology ROS (+)   Anesthesia Other Findings   Reproductive/Obstetrics                             Anesthesia Physical Anesthesia Plan  ASA: II  Anesthesia Plan: General   Post-op Pain Management:    Induction: Intravenous  Airway Management Planned: Oral ETT  Additional Equipment:   Intra-op Plan:   Post-operative Plan: Extubation in OR  Informed Consent: I have reviewed the patients History and Physical, chart, labs and discussed the procedure including the risks, benefits and alternatives for the proposed anesthesia with the patient or authorized representative who has indicated his/her understanding and acceptance.   Dental advisory given  Plan Discussed with: Anesthesiologist and CRNA  Anesthesia Plan Comments:         Anesthesia Quick Evaluation

## 2016-04-24 NOTE — H&P (Signed)
History and Physical Interval Note:  04/24/2016 9:24 AM  Sylvia George  has presented today for surgery, with the diagnosis of chronic pelvic pain,endometriosis  The various methods of treatment have been discussed with the patient and family. After consideration of risks, benefits and other options for treatment, the patient has consented to  Procedure(s): HYSTERECTOMY TOTAL LAPAROSCOPIC / BILATERAL SALPINGECTOMY (Bilateral) as a surgical intervention .  The patient's history has been reviewed, patient examined, no change in status, stable for surgery.  Pt has the following beta blocker history-  Not taking Beta Blocker.  I have reviewed the patient's chart and labs.  Questions were answered to the patient's satisfaction.    Hoyt Koch

## 2016-04-28 LAB — SURGICAL PATHOLOGY

## 2016-11-13 ENCOUNTER — Encounter: Payer: Self-pay | Admitting: *Deleted

## 2016-11-13 DIAGNOSIS — R112 Nausea with vomiting, unspecified: Secondary | ICD-10-CM | POA: Insufficient documentation

## 2016-11-13 DIAGNOSIS — R1011 Right upper quadrant pain: Secondary | ICD-10-CM | POA: Insufficient documentation

## 2016-11-13 DIAGNOSIS — F17210 Nicotine dependence, cigarettes, uncomplicated: Secondary | ICD-10-CM | POA: Insufficient documentation

## 2016-11-13 LAB — URINALYSIS, COMPLETE (UACMP) WITH MICROSCOPIC
Bacteria, UA: NONE SEEN
Bilirubin Urine: NEGATIVE
GLUCOSE, UA: NEGATIVE mg/dL
HGB URINE DIPSTICK: NEGATIVE
KETONES UR: NEGATIVE mg/dL
LEUKOCYTES UA: NEGATIVE
NITRITE: NEGATIVE
PH: 5 (ref 5.0–8.0)
Protein, ur: NEGATIVE mg/dL
Specific Gravity, Urine: 1.028 (ref 1.005–1.030)

## 2016-11-13 LAB — COMPREHENSIVE METABOLIC PANEL
ALK PHOS: 69 U/L (ref 38–126)
ALT: 21 U/L (ref 14–54)
ANION GAP: 8 (ref 5–15)
AST: 26 U/L (ref 15–41)
Albumin: 4.3 g/dL (ref 3.5–5.0)
BUN: 14 mg/dL (ref 6–20)
CALCIUM: 9.6 mg/dL (ref 8.9–10.3)
CO2: 24 mmol/L (ref 22–32)
CREATININE: 0.68 mg/dL (ref 0.44–1.00)
Chloride: 106 mmol/L (ref 101–111)
Glucose, Bld: 111 mg/dL — ABNORMAL HIGH (ref 65–99)
Potassium: 3.6 mmol/L (ref 3.5–5.1)
Sodium: 138 mmol/L (ref 135–145)
TOTAL PROTEIN: 7.8 g/dL (ref 6.5–8.1)
Total Bilirubin: 0.1 mg/dL — ABNORMAL LOW (ref 0.3–1.2)

## 2016-11-13 LAB — CBC
HCT: 38.4 % (ref 35.0–47.0)
Hemoglobin: 13.3 g/dL (ref 12.0–16.0)
MCH: 31.3 pg (ref 26.0–34.0)
MCHC: 34.6 g/dL (ref 32.0–36.0)
MCV: 90.4 fL (ref 80.0–100.0)
PLATELETS: 294 10*3/uL (ref 150–440)
RBC: 4.24 MIL/uL (ref 3.80–5.20)
RDW: 12.8 % (ref 11.5–14.5)
WBC: 13.4 10*3/uL — AB (ref 3.6–11.0)

## 2016-11-13 LAB — LIPASE, BLOOD: Lipase: 27 U/L (ref 11–51)

## 2016-11-13 NOTE — ED Triage Notes (Signed)
Pt has right upper abd pain for 4 days.  Vomiting x 5 today.  No diarrhea.  No vag bleeding.   No dysuria.  Pt alert

## 2016-11-14 ENCOUNTER — Emergency Department: Payer: Self-pay

## 2016-11-14 ENCOUNTER — Emergency Department
Admission: EM | Admit: 2016-11-14 | Discharge: 2016-11-14 | Disposition: A | Discharge status: Home / Self Care | Payer: Self-pay | Attending: Emergency Medicine | Admitting: Emergency Medicine

## 2016-11-14 DIAGNOSIS — R1011 Right upper quadrant pain: Secondary | ICD-10-CM

## 2016-11-14 DIAGNOSIS — R112 Nausea with vomiting, unspecified: Secondary | ICD-10-CM

## 2016-11-14 MED ORDER — MORPHINE SULFATE (PF) 4 MG/ML IV SOLN
4.0000 mg | Freq: Once | INTRAVENOUS | Status: AC
  Administered 2016-11-14: 4 mg via INTRAVENOUS
  Filled 2016-11-14: qty 1

## 2016-11-14 MED ORDER — ONDANSETRON 4 MG PO TBDP: 4.0000 mg | ORAL_TABLET | Freq: Three times a day (TID) | ORAL | 0 refills | Status: DC | PRN

## 2016-11-14 MED ORDER — ONDANSETRON HCL 4 MG/2ML IJ SOLN
4.0000 mg | Freq: Once | INTRAMUSCULAR | Status: AC
  Administered 2016-11-14: 4 mg via INTRAVENOUS
  Filled 2016-11-14: qty 2

## 2016-11-14 MED ORDER — FAMOTIDINE 20 MG PO TABS: 20.0000 mg | ORAL_TABLET | Freq: Two times a day (BID) | ORAL | 0 refills | Status: DC

## 2016-11-14 MED ORDER — SUCRALFATE 1 GM/10ML PO SUSP: 1.0000 g | Freq: Four times a day (QID) | ORAL | 1 refills | Status: DC

## 2016-11-14 MED ORDER — SODIUM CHLORIDE 0.9 % IV BOLUS (SEPSIS)
1000.0000 mL | Freq: Once | INTRAVENOUS | Status: AC
  Administered 2016-11-14: 1000 mL via INTRAVENOUS

## 2016-11-14 NOTE — Discharge Instructions (Signed)
1. You may take Zofran as needed for nausea. 2. Start Pepcid 20 mg twice daily. 3. Start Carafate 3 times daily with meals and at bedtime. 4. Eat a bland diet for then next 5 days, then slowly advance diet as tolerated. Avoid greasy, heavy, spicy foods or alcohol. 5. Return to the ER for worsening symptoms, persistent vomiting, difficulty breathing or other concerns.

## 2016-11-14 NOTE — ED Provider Notes (Signed)
[facility] Emergency Department Provider Note   ____________________________________________   First MD Initiated Contact with Patient 11/14/16 [phone]     (approximate)  I have reviewed the triage vital signs and the nursing notes.   HISTORY  Chief Complaint Abdominal Pain and Emesis    HPI Sylvia George is a 31 y.o. female who presents to the ED from home with a chief complaint of abdominal pain. She reports a 4 day history of right upper abdominal throbbing type pain. Describes nonradiating pain worsened by food and associated with vomiting. Reports 5 bouts of emesis today. Denies associated fever, chills, chest pain, shortness of breath, dysuria, diarrhea. Denies recent travel trauma. Nothing makes her symptoms better or worse.   Past Medical History:  Diagnosis Date  . Headache     Patient Active Problem List   Diagnosis Date Noted  . Chronic pelvic pain in female 04/24/2016    Past Surgical History:  Procedure Laterality Date  . CYSTOSCOPY  04/24/2016   Procedure: CYSTOSCOPY;  Surgeon: Gae Dry, MD;  Location: [facility] ORS;  Service: Gynecology;;  . DILATION AND CURETTAGE OF UTERUS    . DILITATION & CURRETTAGE/HYSTROSCOPY WITH ESSURE    . LAPAROSCOPIC HYSTERECTOMY Bilateral 04/24/2016   Procedure: HYSTERECTOMY TOTAL LAPAROSCOPIC / BILATERAL SALPINGECTOMY;  Surgeon: Gae Dry, MD;  Location: [facility] ORS;  Service: Gynecology;  Laterality: Bilateral;  . LAPAROSCOPIC OVARIAN CYSTECTOMY Right 04/24/2016   Procedure: LAPAROSCOPIC OVARIAN CYSTECTOMY;  Surgeon: Gae Dry, MD;  Location: [facility] ORS;  Service: Gynecology;  Laterality: Right;  . SALPINGECTOMY      Prior to Admission medications   Medication Sig Start Date End Date Taking? Authorizing Provider  famotidine (PEPCID) 20 MG tablet Take 1 tablet (20 mg total) by mouth 2 (two) times daily. 11/14/16   Paulette Blanch, MD  ibuprofen (ADVIL,MOTRIN) 200 MG tablet Take 800 mg by mouth  every 8 (eight) hours as needed.    Historical Provider, MD  ondansetron (ZOFRAN ODT) 4 MG disintegrating tablet Take 1 tablet (4 mg total) by mouth every 8 (eight) hours as needed for nausea or vomiting. 11/14/16   Paulette Blanch, MD  oxyCODONE-acetaminophen (PERCOCET) 5-325 MG tablet Take 1 tablet by mouth every 4 (four) hours as needed for moderate pain or severe pain. 04/24/16   Gae Dry, MD  sucralfate (CARAFATE) 1 GM/10ML suspension Take 10 mLs (1 g total) by mouth 4 (four) times daily. 11/14/16   Paulette Blanch, MD    Allergies Vicodin [hydrocodone-acetaminophen]  Family History  Problem Relation Age of Onset  . Diabetes Mother   . Hypertension Mother   . Heart attack Father     Social History Social History  Substance Use Topics  . Smoking status: Current Every Day Smoker    Packs/day: 1.00    Types: Cigarettes  . Smokeless tobacco: Never Used  . Alcohol use No    Review of Systems  Constitutional: No fever/chills. Eyes: No visual changes. ENT: No sore throat. Cardiovascular: Denies chest pain. Respiratory: Denies shortness of breath. Gastrointestinal:Positive for abdominal pain, nausea and vomiting.  No diarrhea.  No constipation. Genitourinary: Negative for dysuria. Musculoskeletal: Negative for back pain. Skin: Negative for rash. Neurological: Negative for headaches, focal weakness or numbness.  10-point ROS otherwise negative.  ____________________________________________   PHYSICAL EXAM:  VITAL SIGNS: ED Triage Vitals [11/13/16 2145]  Enc Vitals Group     BP (!) 126/55     Pulse Rate 82  Resp 16     Temp 98.5 F (36.9 C)     Temp Source Oral     SpO2 99 %     Weight 181 lb (82.1 kg)     Height 5\' 2"  (1.575 m)     Head Circumference      Peak Flow      Pain Score 8     Pain Loc      Pain Edu?      Excl. in [place]?     Constitutional: Alert and oriented. Well appearing and in no acute distress. Eyes: Conjunctivae are normal. PERRL.  EOMI. Head: Atraumatic. Nose: No congestion/rhinnorhea. Mouth/Throat: Mucous membranes are moist.  Oropharynx non-erythematous. Neck: No stridor.   Cardiovascular: Normal rate, regular rhythm. Grossly normal heart sounds.  Good peripheral circulation. Respiratory: Normal respiratory effort.  No retractions. Lungs CTAB. Gastrointestinal: Soft and mildly tender to palpation right upper quadrant without rebound or guarding. No distention. No abdominal bruits. No CVA tenderness. Musculoskeletal: No lower extremity tenderness nor edema.  No joint effusions. Neurologic:  Normal speech and language. No gross focal neurologic deficits are appreciated. No gait instability. Skin:  Skin is warm, dry and intact. No rash noted. Psychiatric: Mood and affect are normal. Speech and behavior are normal.  ____________________________________________   LABS (all labs ordered are listed, but only abnormal results are displayed)  Labs Reviewed  COMPREHENSIVE METABOLIC PANEL - Abnormal; Notable for the following:       Result Value   Glucose, Bld 111 (*)    Total Bilirubin 0.1 (*)    All other components within normal limits  CBC - Abnormal; Notable for the following:    WBC 13.4 (*)    All other components within normal limits  URINALYSIS, COMPLETE (UACMP) WITH MICROSCOPIC - Abnormal; Notable for the following:    Color, Urine YELLOW (*)    APPearance CLEAR (*)    Squamous Epithelial / LPF 0-5 (*)    All other components within normal limits  LIPASE, BLOOD   ____________________________________________  EKG  None ____________________________________________  RADIOLOGY  Ultrasound interpreted per Dr. Gerilyn Nestle: Normal examination. ____________________________________________   PROCEDURES  Procedure(s) performed: None  Procedures  Critical Care performed: No  ____________________________________________   INITIAL IMPRESSION / ASSESSMENT AND PLAN / ED COURSE  Pertinent labs &  imaging results that were available during my care of the patient were reviewed by me and considered in my medical decision making (see chart for details).  31 year old female who presents with right upper quadrant abdominal pain associated with vomiting. Initial lab work and urinalysis remarkable for mild leukocytosis; LFTs and lipase are within normal limits. Will initiate IV fluid resuscitation, administer IV analgesics and proceed with right upper quadrant limited ultrasound to evaluate for cholecystitis.  Clinical Course as of Nov 14 402  Fri Nov 14, 2016  0319 Patient sleeping soundly. Awaken patient to update her of negative imaging results. Pain is much improved. Will place patient on a trial of H2 blocker as well as Carafate. Tolerated PO without emesis. She will follow-up with her PCP next week. Strict return precautions given. Patient verbalizes understanding and agrees with plan of care.  [JS]    Clinical Course User Index [JS] Paulette Blanch, MD     ____________________________________________   FINAL CLINICAL IMPRESSION(S) / ED DIAGNOSES  Final diagnoses:  Right upper quadrant abdominal pain  Non-intractable vomiting with nausea, unspecified vomiting type      NEW MEDICATIONS STARTED DURING THIS VISIT:  New Prescriptions  FAMOTIDINE (PEPCID) 20 MG TABLET    Take 1 tablet (20 mg total) by mouth 2 (two) times daily.   ONDANSETRON (ZOFRAN ODT) 4 MG DISINTEGRATING TABLET    Take 1 tablet (4 mg total) by mouth every 8 (eight) hours as needed for nausea or vomiting.   SUCRALFATE (CARAFATE) 1 GM/10ML SUSPENSION    Take 10 mLs (1 g total) by mouth 4 (four) times daily.     Note:  This document was prepared using Dragon voice recognition software and may include unintentional dictation errors.    Paulette Blanch, MD 11/14/16 [id number]

## 2016-11-14 NOTE — ED Notes (Signed)
Pt taken to US

## 2017-04-29 ENCOUNTER — Emergency Department
Admission: EM | Admit: 2017-04-29 | Discharge: 2017-04-29 | Disposition: A | Discharge status: Home / Self Care | Payer: PRIVATE HEALTH INSURANCE | Attending: Emergency Medicine | Admitting: Emergency Medicine

## 2017-04-29 ENCOUNTER — Encounter: Payer: Self-pay | Admitting: Emergency Medicine

## 2017-04-29 DIAGNOSIS — Z79899 Other long term (current) drug therapy: Secondary | ICD-10-CM | POA: Insufficient documentation

## 2017-04-29 DIAGNOSIS — F17210 Nicotine dependence, cigarettes, uncomplicated: Secondary | ICD-10-CM | POA: Insufficient documentation

## 2017-04-29 DIAGNOSIS — R112 Nausea with vomiting, unspecified: Secondary | ICD-10-CM | POA: Insufficient documentation

## 2017-04-29 DIAGNOSIS — R197 Diarrhea, unspecified: Secondary | ICD-10-CM | POA: Insufficient documentation

## 2017-04-29 DIAGNOSIS — R109 Unspecified abdominal pain: Secondary | ICD-10-CM | POA: Insufficient documentation

## 2017-04-29 LAB — URINALYSIS, COMPLETE (UACMP) WITH MICROSCOPIC
Bacteria, UA: NONE SEEN
Bilirubin Urine: NEGATIVE
GLUCOSE, UA: NEGATIVE mg/dL
Hgb urine dipstick: NEGATIVE
Ketones, ur: NEGATIVE mg/dL
Leukocytes, UA: NEGATIVE
Nitrite: NEGATIVE
PROTEIN: NEGATIVE mg/dL
RBC / HPF: NONE SEEN RBC/hpf (ref 0–5)
SPECIFIC GRAVITY, URINE: 1.008 (ref 1.005–1.030)
WBC, UA: NONE SEEN WBC/hpf (ref 0–5)
pH: 6 (ref 5.0–8.0)

## 2017-04-29 LAB — CBC
HCT: 41.3 % (ref 35.0–47.0)
Hemoglobin: 14.3 g/dL (ref 12.0–16.0)
MCH: 31.1 pg (ref 26.0–34.0)
MCHC: 34.6 g/dL (ref 32.0–36.0)
MCV: 90 fL (ref 80.0–100.0)
PLATELETS: 338 10*3/uL (ref 150–440)
RBC: 4.59 MIL/uL (ref 3.80–5.20)
RDW: 12.9 % (ref 11.5–14.5)
WBC: 16.3 10*3/uL — ABNORMAL HIGH (ref 3.6–11.0)

## 2017-04-29 LAB — COMPREHENSIVE METABOLIC PANEL
ALBUMIN: 4.6 g/dL (ref 3.5–5.0)
ALK PHOS: 75 U/L (ref 38–126)
ALT: 20 U/L (ref 14–54)
AST: 22 U/L (ref 15–41)
Anion gap: 7 (ref 5–15)
BUN: 8 mg/dL (ref 6–20)
CO2: 25 mmol/L (ref 22–32)
CREATININE: 0.82 mg/dL (ref 0.44–1.00)
Calcium: 9.6 mg/dL (ref 8.9–10.3)
Chloride: 105 mmol/L (ref 101–111)
GFR calc non Af Amer: >60 mL/min (ref >60)
GLUCOSE: 102 mg/dL — AB (ref 65–99)
Potassium: 3.5 mmol/L (ref 3.5–5.1)
SODIUM: 137 mmol/L (ref 135–145)
Total Bilirubin: 0.3 mg/dL (ref 0.3–1.2)
Total Protein: 8.1 g/dL (ref 6.5–8.1)

## 2017-04-29 LAB — LIPASE, BLOOD: Lipase: 29 U/L (ref 11–51)

## 2017-04-29 MED ORDER — ONDANSETRON 4 MG PO TBDP
ORAL_TABLET | ORAL | Status: AC
  Administered 2017-04-29: 4 mg via ORAL
  Filled 2017-04-29: qty 1

## 2017-04-29 MED ORDER — LOPERAMIDE HCL 2 MG PO TABS: 2.0000 mg | ORAL_TABLET | Freq: Four times a day (QID) | ORAL | 0 refills | Status: DC | PRN

## 2017-04-29 MED ORDER — LOPERAMIDE HCL 2 MG PO CAPS
ORAL_CAPSULE | ORAL | Status: AC
  Administered 2017-04-29: 4 mg via ORAL
  Filled 2017-04-29: qty 2

## 2017-04-29 MED ORDER — ONDANSETRON 4 MG PO TBDP
4.0000 mg | ORAL_TABLET | Freq: Once | ORAL | Status: AC
  Administered 2017-04-29: 4 mg via ORAL

## 2017-04-29 MED ORDER — ONDANSETRON 4 MG PO TBDP: 4.0000 mg | ORAL_TABLET | Freq: Three times a day (TID) | ORAL | 0 refills | Status: DC | PRN

## 2017-04-29 MED ORDER — LOPERAMIDE HCL 2 MG PO CAPS
4.0000 mg | ORAL_CAPSULE | Freq: Once | ORAL | Status: AC
  Administered 2017-04-29: 4 mg via ORAL

## 2017-04-29 NOTE — ED Provider Notes (Signed)
[facility] Emergency Department Provider Note  Time seen: 6:39 PM  I have reviewed the triage vital signs and the nursing notes.   HISTORY  Chief Complaint Emesis; Diarrhea; and Abdominal Pain    HPI Sylvia George is a 31 y.o. female who presents to the emergency department with nausea vomiting and diarrhea. Patient is part of a family of 4 here for the same symptoms as started the same time. Patient states she ate cube steak and potatoes last night as well as a birthday cake. Patient awoke at 5:00 this morning at the same time as her fianc, and 2 children. Patient states she has been nauseated since with frequent episodes of vomiting and diarrhea. States mild diffuse abdominal cramping. No focal tenderness. Denies cough or congestion. Denies fever. Denies dysuria or vaginal bleeding or discharge.  Past Medical History:  Diagnosis Date  . Headache     Patient Active Problem List   Diagnosis Date Noted  . Chronic pelvic pain in female 04/24/2016    Past Surgical History:  Procedure Laterality Date  . CYSTOSCOPY  04/24/2016   Procedure: CYSTOSCOPY;  Surgeon: Gae Dry, MD;  Location: [facility] ORS;  Service: Gynecology;;  . DILATION AND CURETTAGE OF UTERUS    . DILITATION & CURRETTAGE/HYSTROSCOPY WITH ESSURE    . LAPAROSCOPIC HYSTERECTOMY Bilateral 04/24/2016   Procedure: HYSTERECTOMY TOTAL LAPAROSCOPIC / BILATERAL SALPINGECTOMY;  Surgeon: Gae Dry, MD;  Location: [facility] ORS;  Service: Gynecology;  Laterality: Bilateral;  . LAPAROSCOPIC OVARIAN CYSTECTOMY Right 04/24/2016   Procedure: LAPAROSCOPIC OVARIAN CYSTECTOMY;  Surgeon: Gae Dry, MD;  Location: [facility] ORS;  Service: Gynecology;  Laterality: Right;  . SALPINGECTOMY      Prior to Admission medications   Medication Sig Start Date End Date Taking? Authorizing Provider  famotidine (PEPCID) 20 MG tablet Take 1 tablet (20 mg total) by mouth 2 (two) times daily. 11/14/16   Paulette Blanch, MD   ibuprofen (ADVIL,MOTRIN) 200 MG tablet Take 800 mg by mouth every 8 (eight) hours as needed.    [provider]  ondansetron (ZOFRAN ODT) 4 MG disintegrating tablet Take 1 tablet (4 mg total) by mouth every 8 (eight) hours as needed for nausea or vomiting. 11/14/16   Paulette Blanch, MD  oxyCODONE-acetaminophen (PERCOCET) 5-325 MG tablet Take 1 tablet by mouth every 4 (four) hours as needed for moderate pain or severe pain. 04/24/16   Gae Dry, MD  sucralfate (CARAFATE) 1 GM/10ML suspension Take 10 mLs (1 g total) by mouth 4 (four) times daily. 11/14/16   Paulette Blanch, MD    Allergies  Allergen Reactions  . Vicodin [Hydrocodone-Acetaminophen] Nausea And Vomiting    Family History  Problem Relation Age of Onset  . Diabetes Mother   . Hypertension Mother   . Heart attack Father     Social History Social History  Substance Use Topics  . Smoking status: Current Every Day Smoker    Packs/day: 1.00    Types: Cigarettes  . Smokeless tobacco: Never Used  . Alcohol use No    Review of Systems Constitutional: Negative for fever. Cardiovascular: Negative for chest pain. Respiratory: Negative for shortness of breath.Negative for cough Gastrointestinal: Mild abdominal cramping. No focal pain. Positive for nausea vomiting and diarrhea Genitourinary: Negative for dysuria. Neurological: Negative for headache All other ROS negative  ____________________________________________   PHYSICAL EXAM:  VITAL SIGNS: ED Triage Vitals  Enc Vitals Group     BP 04/29/17 1727 127/81  Pulse --      Resp 04/29/17 1727 18     Temp 04/29/17 1727 98.8 F (37.1 C)     Temp Source 04/29/17 1727 Oral     SpO2 04/29/17 1727 99 %     Weight 04/29/17 1730 189 lb (85.7 kg)     Height 04/29/17 1730 5\' 4"  (1.626 m)     Head Circumference --      Peak Flow --      Pain Score 04/29/17 1730 5     Pain Loc --      Pain Edu? --      Excl. in [place]? --     Constitutional: Alert and oriented.  Well appearing and in no distress. Eyes: Normal exam ENT   Head: Normocephalic and atraumatic   Mouth/Throat: Mucous membranes are moist. Cardiovascular: Normal rate, regular rhythm. No murmur Respiratory: Normal respiratory effort without tachypnea nor retractions. Breath sounds are clear Gastrointestinal: Soft, slight epigastric tenderness otherwise nontender abdomen. No rebound or guarding. No distention. No CVA tenderness. Musculoskeletal: Nontender with normal range of motion in all extremities.  Neurologic:  Normal speech and language. No gross focal neurologic deficits Skin:  Skin is warm, dry and intact.  Psychiatric: Mood and affect are normal.   ____________________________________________    INITIAL IMPRESSION / ASSESSMENT AND PLAN / ED COURSE  Pertinent labs & imaging results that were available during my care of the patient were reviewed by me and considered in my medical decision making (see chart for details).  Patient presents with symptoms suspicious for gastroenteritis versus food poisoning. Patient is part of his family of 4 here with symptoms that began within 1 hour of each other, which points to food poisoning is a likely culprit. Overall the patient appears well, nontoxic. No focal tenderness on exam. We will discharge with Zofran, loperamide. I discussed return precautions.  ____________________________________________   FINAL CLINICAL IMPRESSION(S) / ED DIAGNOSES  Nausea vomiting diarrhea    Harvest Dark, MD 04/29/17 [phone]

## 2017-04-29 NOTE — ED Notes (Signed)
Alert, oriented, ambulatory.   Pt states celebrating birthday last night with cake from [employer] and woke up this AM with N&V&D. Denies blood in vomit or stool.

## 2017-04-29 NOTE — ED Triage Notes (Signed)
Pt reports NVD and abdominal pain that began today. Pt reports entire family has it and has associated it with what they ate last night. Pt in no apparent distress in triage.

## 2017-05-17 ENCOUNTER — Emergency Department
Admission: EM | Admit: 2017-05-17 | Discharge: 2017-05-17 | Disposition: A | Discharge status: Home / Self Care | Payer: PRIVATE HEALTH INSURANCE | Attending: Emergency Medicine | Admitting: Emergency Medicine

## 2017-05-17 DIAGNOSIS — R1010 Upper abdominal pain, unspecified: Secondary | ICD-10-CM | POA: Insufficient documentation

## 2017-05-17 DIAGNOSIS — Z5321 Procedure and treatment not carried out due to patient leaving prior to being seen by health care provider: Secondary | ICD-10-CM | POA: Insufficient documentation

## 2017-05-17 DIAGNOSIS — R112 Nausea with vomiting, unspecified: Secondary | ICD-10-CM | POA: Insufficient documentation

## 2017-05-17 LAB — COMPREHENSIVE METABOLIC PANEL
ALT: 17 U/L (ref 14–54)
ANION GAP: 9 (ref 5–15)
AST: 23 U/L (ref 15–41)
Albumin: 4.4 g/dL (ref 3.5–5.0)
Alkaline Phosphatase: 78 U/L (ref 38–126)
BUN: 10 mg/dL (ref 6–20)
CHLORIDE: 106 mmol/L (ref 101–111)
CO2: 24 mmol/L (ref 22–32)
CREATININE: 0.68 mg/dL (ref 0.44–1.00)
Calcium: 9.6 mg/dL (ref 8.9–10.3)
GLUCOSE: 103 mg/dL — AB (ref 65–99)
Potassium: 3.6 mmol/L (ref 3.5–5.1)
SODIUM: 139 mmol/L (ref 135–145)
Total Bilirubin: 0.5 mg/dL (ref 0.3–1.2)
Total Protein: 8 g/dL (ref 6.5–8.1)

## 2017-05-17 LAB — CBC
HCT: 41.6 % (ref 35.0–47.0)
Hemoglobin: 14.5 g/dL (ref 12.0–16.0)
MCH: 31.7 pg (ref 26.0–34.0)
MCHC: 34.8 g/dL (ref 32.0–36.0)
MCV: 91 fL (ref 80.0–100.0)
Platelets: 301 10*3/uL (ref 150–440)
RBC: 4.57 MIL/uL (ref 3.80–5.20)
RDW: 12.8 % (ref 11.5–14.5)
WBC: 14.5 10*3/uL — ABNORMAL HIGH (ref 3.6–11.0)

## 2017-05-17 LAB — LIPASE, BLOOD: LIPASE: 31 U/L (ref 11–51)

## 2017-05-17 NOTE — ED Triage Notes (Signed)
Reports here earlier this month for similar symptoms and told was not her gall bladder.  Patient reports having pain with nausea and vomiting every time she eats.  Reports pain radiates under ribs and across top of abdomen.

## 2017-05-17 NOTE — ED Notes (Signed)
Pt updated on wait and is understanding.  

## 2017-05-17 NOTE — ED Notes (Signed)
Called for room x 3 with no answer 

## 2017-05-18 ENCOUNTER — Telehealth: Payer: Self-pay | Admitting: Emergency Medicine

## 2017-05-18 ENCOUNTER — Encounter: Payer: Self-pay | Admitting: Medical Oncology

## 2017-05-18 ENCOUNTER — Emergency Department
Admission: EM | Admit: 2017-05-18 | Discharge: 2017-05-18 | Disposition: A | Discharge status: Home / Self Care | Payer: PRIVATE HEALTH INSURANCE | Attending: Emergency Medicine | Admitting: Emergency Medicine

## 2017-05-18 DIAGNOSIS — R112 Nausea with vomiting, unspecified: Secondary | ICD-10-CM | POA: Insufficient documentation

## 2017-05-18 DIAGNOSIS — F17210 Nicotine dependence, cigarettes, uncomplicated: Secondary | ICD-10-CM | POA: Insufficient documentation

## 2017-05-18 DIAGNOSIS — Z79899 Other long term (current) drug therapy: Secondary | ICD-10-CM | POA: Insufficient documentation

## 2017-05-18 DIAGNOSIS — R197 Diarrhea, unspecified: Secondary | ICD-10-CM | POA: Insufficient documentation

## 2017-05-18 DIAGNOSIS — R110 Nausea: Secondary | ICD-10-CM

## 2017-05-18 LAB — URINALYSIS, COMPLETE (UACMP) WITH MICROSCOPIC
BILIRUBIN URINE: NEGATIVE
Glucose, UA: NEGATIVE mg/dL
Hgb urine dipstick: NEGATIVE
KETONES UR: NEGATIVE mg/dL
LEUKOCYTES UA: NEGATIVE
NITRITE: NEGATIVE
PH: 7 (ref 5.0–8.0)
Protein, ur: NEGATIVE mg/dL
SPECIFIC GRAVITY, URINE: 1.011 (ref 1.005–1.030)

## 2017-05-18 LAB — COMPREHENSIVE METABOLIC PANEL
ALBUMIN: 4.5 g/dL (ref 3.5–5.0)
ALK PHOS: 77 U/L (ref 38–126)
ALT: 16 U/L (ref 14–54)
AST: 22 U/L (ref 15–41)
Anion gap: 10 (ref 5–15)
BILIRUBIN TOTAL: 0.2 mg/dL — AB (ref 0.3–1.2)
BUN: 9 mg/dL (ref 6–20)
CALCIUM: 9.6 mg/dL (ref 8.9–10.3)
CO2: 23 mmol/L (ref 22–32)
CREATININE: 0.64 mg/dL (ref 0.44–1.00)
Chloride: 107 mmol/L (ref 101–111)
GFR calc Af Amer: >60 mL/min (ref >60)
GLUCOSE: 96 mg/dL (ref 65–99)
POTASSIUM: 3.7 mmol/L (ref 3.5–5.1)
Sodium: 140 mmol/L (ref 135–145)
TOTAL PROTEIN: 7.8 g/dL (ref 6.5–8.1)

## 2017-05-18 LAB — CBC
HCT: 40.3 % (ref 35.0–47.0)
Hemoglobin: 14.2 g/dL (ref 12.0–16.0)
MCH: 31.4 pg (ref 26.0–34.0)
MCHC: 35.2 g/dL (ref 32.0–36.0)
MCV: 89.1 fL (ref 80.0–100.0)
Platelets: 309 10*3/uL (ref 150–440)
RBC: 4.52 MIL/uL (ref 3.80–5.20)
RDW: 12.8 % (ref 11.5–14.5)
WBC: 13.1 10*3/uL — AB (ref 3.6–11.0)

## 2017-05-18 LAB — LIPASE, BLOOD: Lipase: 31 U/L (ref 11–51)

## 2017-05-18 NOTE — Telephone Encounter (Signed)
Called patient due to lwot to inquire about condition and follow up plans. Says she had to leave because she had to work this am.  She had planned to come back after work, as sheis still having pain and nausea.  She does have pcp-[facility].  I told her to try them first, and they may be able to see her and examine--we already did the labwork.  If they could not see her she will return here for exam.

## 2017-05-18 NOTE — Discharge Instructions (Signed)
Turn to the emergency room for increased pain, fever, bleeding, bright red blood per rectum, or any other new or worrisome symptoms. Continue taking the antacids, and follow closely with primary care and GI medicine.

## 2017-05-18 NOTE — ED Provider Notes (Signed)
[facility] Emergency Department Provider Note  ____________________________________________   I have reviewed the triage vital signs and the nursing notes.   HISTORY  Chief Complaint Abdominal Pain    HPI Sylvia George is a 31 y.o. female  who presents today complaining of chronic abdominal pain. Flow 60 months she states that she just looks at food and feels full. Patient does drink a large amount of soda and feels that she may have acid indigestion. She has been seen for this many times before. She had a negative right upper quadrant ultrasound and has had negative CTs for. She was referred to a surgeon and made by her primary care doctor. Surgery felt that this was a nonsurgical issue and referred her back to primary care. She has not yet seen her primary care doctor again. She has ongoing occasional nausea and sometimes vomiting with this for the last 6 months. She has had no weight loss, in fact she has gained 30 pounds. She denies any fever or chills or diarrhea. Patient states she does have normal bowel movements. Sometimes it is loose. It was a little bit loose over the last couple days but now is resolving. She denies any other symptoms and at this time has no pain. She used to have chronic pelvic pain however after her hysterectomy that is completely resolved. A history of pancreatitis.   Past Medical History:  Diagnosis Date  . Headache     Patient Active Problem List   Diagnosis Date Noted  . Chronic pelvic pain in female 04/24/2016    Past Surgical History:  Procedure Laterality Date  . CYSTOSCOPY  04/24/2016   Procedure: CYSTOSCOPY;  Surgeon: Gae Dry, MD;  Location: [facility] ORS;  Service: Gynecology;;  . DILATION AND CURETTAGE OF UTERUS    . DILITATION & CURRETTAGE/HYSTROSCOPY WITH ESSURE    . LAPAROSCOPIC HYSTERECTOMY Bilateral 04/24/2016   Procedure: HYSTERECTOMY TOTAL LAPAROSCOPIC / BILATERAL SALPINGECTOMY;  Surgeon: Gae Dry, MD;  Location: [facility] ORS;  Service: Gynecology;  Laterality: Bilateral;  . LAPAROSCOPIC OVARIAN CYSTECTOMY Right 04/24/2016   Procedure: LAPAROSCOPIC OVARIAN CYSTECTOMY;  Surgeon: Gae Dry, MD;  Location: [facility] ORS;  Service: Gynecology;  Laterality: Right;  . SALPINGECTOMY      Prior to Admission medications   Medication Sig Start Date End Date Taking? Authorizing Provider  famotidine (PEPCID) 20 MG tablet Take 1 tablet (20 mg total) by mouth 2 (two) times daily. 11/14/16   Paulette Blanch, MD  ibuprofen (ADVIL,MOTRIN) 200 MG tablet Take 800 mg by mouth every 8 (eight) hours as needed.    [provider]  loperamide (IMODIUM A-D) 2 MG tablet Take 1 tablet (2 mg total) by mouth 4 (four) times daily as needed for diarrhea or loose stools. 04/29/17   Harvest Dark, MD  ondansetron (ZOFRAN ODT) 4 MG disintegrating tablet Take 1 tablet (4 mg total) by mouth every 8 (eight) hours as needed for nausea or vomiting. 04/29/17   Harvest Dark, MD  oxyCODONE-acetaminophen (PERCOCET) 5-325 MG tablet Take 1 tablet by mouth every 4 (four) hours as needed for moderate pain or severe pain. 04/24/16   Gae Dry, MD  sucralfate (CARAFATE) 1 GM/10ML suspension Take 10 mLs (1 g total) by mouth 4 (four) times daily. 11/14/16   Paulette Blanch, MD    Allergies Vicodin [hydrocodone-acetaminophen]  Family History  Problem Relation Age of Onset  . Diabetes Mother   . Hypertension Mother   . Heart attack Father  Social History Social History  Substance Use Topics  . Smoking status: Current Every Day Smoker    Packs/day: 1.00    Types: Cigarettes  . Smokeless tobacco: Never Used  . Alcohol use No    Review of Systems Constitutional: No fever/chills Eyes: No visual changes. ENT: No sore throat. No stiff neck no neck pain Cardiovascular: Denies chest pain. Respiratory: Denies shortness of breath. Gastrointestinal:   Chronic occasional vomiting  Genitourinary: Negative for  dysuria. Musculoskeletal: Negative lower extremity swelling Skin: Negative for rash. Neurological: Negative for severe headaches, focal weakness or numbness.   ____________________________________________   PHYSICAL EXAM:  VITAL SIGNS: ED Triage Vitals [05/18/17 1704]  Enc Vitals Group     BP 139/68     Pulse Rate 95     Resp 16     Temp 98.4 F (36.9 C)     Temp Source Oral     SpO2 98 %     Weight 189 lb (85.7 kg)     Height 5\' 4"  (1.626 m)     Head Circumference      Peak Flow      Pain Score 6     Pain Loc      Pain Edu?      Excl. in [place]?     Constitutional: Alert and oriented. Well appearing and in no acute distress. Eyes: Conjunctivae are normal Head: Atraumatic HEENT: No congestion/rhinnorhea. Mucous membranes are moist.  Oropharynx non-erythematous Neck:   Nontender with no meningismus, no masses, no stridor Cardiovascular: Normal rate, regular rhythm. Grossly normal heart sounds.  Good peripheral circulation. Respiratory: Normal respiratory effort.  No retractions. Lungs CTAB. Abdominal: Soft and nontender. No distention. No guarding no rebound Back:  There is no focal tenderness or step off.  there is no midline tenderness there are no lesions noted. there is no CVA tenderness  Musculoskeletal: No lower extremity tenderness, no upper extremity tenderness. No joint effusions, no DVT signs strong distal pulses no edema Neurologic:  Normal speech and language. No gross focal neurologic deficits are appreciated.  Skin:  Skin is warm, dry and intact. No rash noted. Psychiatric: Mood and affect are normal. Speech and behavior are normal.  ____________________________________________   LABS (all labs ordered are listed, but only abnormal results are displayed)  Labs Reviewed  COMPREHENSIVE METABOLIC PANEL - Abnormal; Notable for the following:       Result Value   Total Bilirubin 0.2 (*)    All other components within normal limits  CBC - Abnormal; Notable  for the following:    WBC 13.1 (*)    All other components within normal limits  URINALYSIS, COMPLETE (UACMP) WITH MICROSCOPIC - Abnormal; Notable for the following:    Color, Urine YELLOW (*)    APPearance CLEAR (*)    Bacteria, UA RARE (*)    Squamous Epithelial / LPF 0-5 (*)    All other components within normal limits  LIPASE, BLOOD   ____________________________________________  EKG  I personally interpreted any EKGs ordered by me or triage _________________________________________  RADIOLOGY  I reviewed any imaging ordered by me or triage that were performed during my shift and, if possible, patient and/or family made aware of any abnormal findings. ____________________________________________   PROCEDURES  Procedure(s) performed: None  Procedures  Critical Care performed: None  ____________________________________________   INITIAL IMPRESSION / ASSESSMENT AND PLAN / ED COURSE  Pertinent labs & imaging results that were available during my care of the patient were reviewed by me  and considered in my medical decision making (see chart for details).  Very very well-appearing patient presents today with a six-day month history of early satiety and nausea and a 30 pound weight gain despite decreased appetite and decreased by mouth consumption. Her exam is very reassuring is no evidence of acute process today. Blood work including liver function tests and lipase are reassuring, white count is slightly elevated but that is chronically the case with this patient. Urinalysis is reassuring. I do not detect any acute emergent condition requiring intervention or imaging at this time. I do feel the patient should have close outpatient follow-up with GI and we will advise that. Return precautions and follow-up up and given understood. She has absolutely no tenderness. Given the chronicity of her complaint and the reassuring nature of her exam and vitals and blood work we will  discharge her. Patient is very comfortable with this plan.    ____________________________________________   FINAL CLINICAL IMPRESSION(S) / ED DIAGNOSES  Final diagnoses:  None      This chart was dictated using voice recognition software.  Despite best efforts to proofread,  errors can occur which can change meaning.      Schuyler Amor, MD 05/18/17 [phone]

## 2017-05-18 NOTE — ED Triage Notes (Signed)
Pt reports abd "fullness" filling that has been going on x 2 months. Pt reports she has been told that she has issues with her gallbladder. Pt reports NV after eating. Pt was seen here last night and LWBS.

## 2017-05-26 ENCOUNTER — Other Ambulatory Visit: Payer: Self-pay

## 2017-05-27 ENCOUNTER — Encounter: Payer: Self-pay | Admitting: Gastroenterology

## 2017-05-27 ENCOUNTER — Ambulatory Visit (INDEPENDENT_AMBULATORY_CARE_PROVIDER_SITE_OTHER): Payer: Self-pay | Admitting: Gastroenterology

## 2017-05-27 ENCOUNTER — Other Ambulatory Visit: Payer: Self-pay

## 2017-05-27 ENCOUNTER — Other Ambulatory Visit
Admission: RE | Admit: 2017-05-27 | Discharge: 2017-05-27 | Disposition: A | Discharge status: Home / Self Care | Payer: Self-pay | Source: Ambulatory Visit | Attending: Gastroenterology | Admitting: Gastroenterology

## 2017-05-27 VITALS — BP 121/75 | HR 88 | Temp 98.7°F | Ht 63.0 in | Wt 184.6 lb

## 2017-05-27 DIAGNOSIS — R739 Hyperglycemia, unspecified: Secondary | ICD-10-CM

## 2017-05-27 DIAGNOSIS — R1013 Epigastric pain: Secondary | ICD-10-CM

## 2017-05-27 DIAGNOSIS — R197 Diarrhea, unspecified: Secondary | ICD-10-CM

## 2017-05-27 LAB — HEMOGLOBIN A1C
Hgb A1c MFr Bld: 5.4 % (ref 4.8–5.6)
Mean Plasma Glucose: 108.28 mg/dL

## 2017-05-27 MED ORDER — OMEPRAZOLE 20 MG PO CPDR: 20.0000 mg | DELAYED_RELEASE_CAPSULE | Freq: Two times a day (BID) | ORAL | 0 refills | Status: DC

## 2017-05-27 NOTE — Patient Instructions (Signed)
1. Start prilosec 20mg  BID before meals 2. Avoid sodas, fatty foods 3. Check H Pylori stool ag test 4. Schedule EGD 5. Check HbA1C  Please call our office to speak with my nurse Driscilla Grammes at [phone] during business hours from 8am to 4pm if you have any questions/concerns. During after hours, you will be redirected to on call GI physician. For any emergency please call 911 or go the nearest emergency room.    Cephas Darby, MD [address]  [place]  [place], [place] [address]  Main: [phone]  Fax: [phone]

## 2017-05-27 NOTE — Progress Notes (Signed)
Sylvia Darby, MD [address]  [place], [place] [address]  Main: [phone]  Fax: [phone]    Gastroenterology Consultation  Referring Provider:     No ref. provider found Primary Care Physician:  Patient, No Pcp Per Primary Gastroenterologist:  Dr. Cephas George Reason for Consultation:     Dyspepsia        HPI:   Sylvia George is a 31 y.o. y/o female referred by Dr. Patient, No Pcp Per  for consultation & management of Dyspepsia. She has history of endometriosis underwent hysterectomy in 03/2016. She has been experiencing postprandial epigastric and right upper quadrant pain associated with nausea and vomiting since 10/2016. She has been drinking a lot of sodas, eating fatty foods and gained about 30 pounds. She used to take a lot of ibuprofen secondary to pain from endometriosis prior to hysterectomy. She is currently not on NSAIDs, no prescription medications. She reports constipation and takes over-the-counter laxatives as needed. She recently went to the ER for the above symptoms and her blood work him back normal. She had a right upper quadrant ultrasound in 10/2016 and was normal. She denies any other upper GI symptoms. On her labs, her random glucose levels were mildly elevated. She does smoke 1 pack per day, does not drink alcohol, denies any drug abuse.  She denies any family history of GI conditions including malignancy  GI Procedures: None  Past Medical History:  Diagnosis Date  . Headache     Past Surgical History:  Procedure Laterality Date  . CYSTOSCOPY  04/24/2016   Procedure: CYSTOSCOPY;  Surgeon: Gae Dry, MD;  Location: [facility];  Service: Gynecology;;  . DILATION AND CURETTAGE OF UTERUS    . DILITATION & CURRETTAGE/HYSTROSCOPY WITH ESSURE    . LAPAROSCOPIC HYSTERECTOMY Bilateral 04/24/2016   Procedure: HYSTERECTOMY TOTAL LAPAROSCOPIC / BILATERAL SALPINGECTOMY;  Surgeon: Gae Dry, MD;  Location: [facility];  Service:  Gynecology;  Laterality: Bilateral;  . LAPAROSCOPIC OVARIAN CYSTECTOMY Right 04/24/2016   Procedure: LAPAROSCOPIC OVARIAN CYSTECTOMY;  Surgeon: Gae Dry, MD;  Location: [facility];  Service: Gynecology;  Laterality: Right;  . SALPINGECTOMY      Prior to Admission medications   Not on File    Family History  Problem Relation Age of Onset  . Diabetes Mother   . Hypertension Mother   . Heart attack Father      Social History  Substance Use Topics  . Smoking status: Current Every Day Smoker    Packs/day: 1.00    Types: Cigarettes  . Smokeless tobacco: Never Used  . Alcohol use No    Allergies as of 05/27/2017 - Review Complete 05/27/2017  Allergen Reaction Noted  . Vicodin [hydrocodone-acetaminophen] Nausea And Vomiting 01/05/2013    Review of Systems:    All systems reviewed and negative except where noted in HPI.   Physical Exam:  BP 121/75   Pulse 88   Temp 98.7 F (37.1 C) (Oral)   Ht 5\' 3"  (1.6 m)   Wt 184 lb 9.6 oz (83.7 kg)   LMP 07/14/2014   BMI 32.70 kg/m  Patient's last menstrual period was 07/14/2014.  General:   Alert,  Well-developed, well-nourished, pleasant and cooperative in NAD Head:  Normocephalic and atraumatic. Eyes:  Sclera clear, no icterus.   Conjunctiva pink. Ears:  Normal auditory acuity. Nose:  No deformity, discharge, or lesions. Mouth:  No deformity or lesions,oropharynx pink & moist. Neck:  Supple; no masses or  thyromegaly. Lungs:  Respirations even and unlabored.  Clear throughout to auscultation.   No wheezes, crackles, or rhonchi. No acute distress. Heart:  Regular rate and rhythm; no murmurs, clicks, rubs, or gallops. Abdomen:  Normal bowel sounds.  No bruits.  Soft, non-tender and non-distended without masses, hepatosplenomegaly or hernias noted.  No guarding or rebound tenderness.   Rectal: Nor performed Msk:  Symmetrical without gross deformities. Good, equal movement & strength bilaterally. Pulses:  Normal pulses  noted. Extremities:  No clubbing or edema.  No cyanosis. Neurologic:  Alert and oriented x3;  grossly normal neurologically. Skin:  Intact without significant lesions or rashes. No jaundice. Lymph Nodes:  No significant cervical adenopathy. Psych:  Alert and cooperative. Normal mood and affect.  Imaging Studies: Right upper quadrant ultrasound 10/2016 normal  Assessment and Plan:   Sylvia George is a 31 y.o. y/o female with History of endometriosis status post hysterectomy, remote history of heavy NSAID use, presents with 4 month history of postprandial epigastric and right upper quadrant pain associated with nausea and nonbloody emesis. Differentials include gastroparesis, peptic ulcer disease, H pyloric gastritis, functional dyspepsia. Patient is self-pay and she wants to know how much would EGD cost out-of-pocket which I'm not aware.  1. Start prilosec 20mg  BID before meals 2. Avoid sodas, fatty foods, and try to lose weight 3. Check H Pylori stool ag test 4. Schedule EGD 5. Check HbA1C  Follow up in 3 months   Sylvia Darby, MD

## 2017-05-28 ENCOUNTER — Telehealth: Payer: Self-pay | Admitting: Gastroenterology

## 2017-05-28 NOTE — Telephone Encounter (Signed)
Patient would like for you to call her for results. Please call today.

## 2017-05-29 NOTE — Telephone Encounter (Signed)
Patient has been notified her stool test is still pendig, but her hgb a1c came back normal.  Thanks Sharyn Lull

## 2017-05-30 LAB — H. PYLORI ANTIGEN, STOOL: H. PYLORI STOOL AG, EIA: NEGATIVE

## 2017-06-04 ENCOUNTER — Telehealth: Payer: Self-pay

## 2017-06-04 NOTE — Telephone Encounter (Signed)
Patient informed her H-Pylori lab is negative. Thanks [employer]

## 2017-06-22 ENCOUNTER — Ambulatory Visit: Payer: Self-pay | Admitting: Gastroenterology

## 2017-10-23 ENCOUNTER — Encounter: Payer: Self-pay | Admitting: Emergency Medicine

## 2017-10-23 ENCOUNTER — Other Ambulatory Visit: Payer: Self-pay

## 2017-10-23 ENCOUNTER — Emergency Department
Admission: EM | Admit: 2017-10-23 | Discharge: 2017-10-23 | Disposition: A | Discharge status: Home / Self Care | Payer: Self-pay | Attending: Emergency Medicine | Admitting: Emergency Medicine

## 2017-10-23 DIAGNOSIS — Z79899 Other long term (current) drug therapy: Secondary | ICD-10-CM | POA: Insufficient documentation

## 2017-10-23 DIAGNOSIS — F17210 Nicotine dependence, cigarettes, uncomplicated: Secondary | ICD-10-CM | POA: Insufficient documentation

## 2017-10-23 DIAGNOSIS — E860 Dehydration: Secondary | ICD-10-CM | POA: Insufficient documentation

## 2017-10-23 DIAGNOSIS — R55 Syncope and collapse: Secondary | ICD-10-CM | POA: Insufficient documentation

## 2017-10-23 DIAGNOSIS — Z733 Stress, not elsewhere classified: Secondary | ICD-10-CM | POA: Insufficient documentation

## 2017-10-23 LAB — CBC
HEMATOCRIT: 43.6 % (ref 35.0–47.0)
HEMOGLOBIN: 15 g/dL (ref 12.0–16.0)
MCH: 31.6 pg (ref 26.0–34.0)
MCHC: 34.4 g/dL (ref 32.0–36.0)
MCV: 91.9 fL (ref 80.0–100.0)
Platelets: 343 10*3/uL (ref 150–440)
RBC: 4.74 MIL/uL (ref 3.80–5.20)
RDW: 12.9 % (ref 11.5–14.5)
WBC: 15.6 10*3/uL — ABNORMAL HIGH (ref 3.6–11.0)

## 2017-10-23 LAB — BASIC METABOLIC PANEL
ANION GAP: 10 (ref 5–15)
BUN: 8 mg/dL (ref 6–20)
CHLORIDE: 105 mmol/L (ref 101–111)
CO2: 24 mmol/L (ref 22–32)
Calcium: 9.4 mg/dL (ref 8.9–10.3)
Creatinine, Ser: 0.61 mg/dL (ref 0.44–1.00)
GFR calc non Af Amer: >60 mL/min (ref >60)
GLUCOSE: 97 mg/dL (ref 65–99)
POTASSIUM: 3.7 mmol/L (ref 3.5–5.1)
Sodium: 139 mmol/L (ref 135–145)

## 2017-10-23 LAB — URINALYSIS, COMPLETE (UACMP) WITH MICROSCOPIC
BILIRUBIN URINE: NEGATIVE
Glucose, UA: NEGATIVE mg/dL
Hgb urine dipstick: NEGATIVE
KETONES UR: NEGATIVE mg/dL
Leukocytes, UA: NEGATIVE
Nitrite: NEGATIVE
PH: 6 (ref 5.0–8.0)
PROTEIN: NEGATIVE mg/dL
Specific Gravity, Urine: 1.02 (ref 1.005–1.030)

## 2017-10-23 MED ORDER — SODIUM CHLORIDE 0.9 % IV BOLUS (SEPSIS)
1000.0000 mL | Freq: Once | INTRAVENOUS | Status: AC
  Administered 2017-10-23: 1000 mL via INTRAVENOUS

## 2017-10-23 NOTE — ED Notes (Signed)

## 2017-10-23 NOTE — Discharge Instructions (Signed)
You have been seen today in the Emergency Department (ED)  for almost passing out (near-syncope).  Your workup including labs and EKG show reassuring results.  Your symptoms may be due to dehydration, so it is important that you drink plenty of non-alcoholic fluids.  Please call your regular doctor as soon as possible to schedule the next available clinic appointment to follow up with him/her regarding your visit to the ED and your symptoms.  Return to the Emergency Department (ED)  if you have any further syncopal episodes (pass out again) or develop ANY chest pain, pressure, tightness, trouble breathing, sudden sweating, or other symptoms that concern you.

## 2017-10-23 NOTE — ED Provider Notes (Signed)
[facility] Emergency Department Provider Note   ____________________________________________   First MD Initiated Contact with Patient 10/23/17 [phone]     (approximate)  I have reviewed the triage vital signs and the nursing notes.   HISTORY  Chief Complaint Near Syncope    HPI Sylvia George is a 32 y.o. female presents after almost passing out this morning  Patient got up, went outside to take her morning routine smoke break.  As she was smoking her cigarette she walked back into the house and started to feel lightheaded.  She then had to lay herself down.  She denies falling and she did not pass out, but she laid on the ground for a few minutes and her fianc noticed that she was "white as a sheet" during this time.  She had no pain or discomfort.  No injury and laid herself on the ground though she thinks she laid her head down to quickly and is a little sore across the back of her scalp.  Denies headache.  She reports she denies anything to eat or drink yet today.  She is been under a lot of stress in her life recently over the last 3-4 months, she reports she has not been eating too well and yesterday he did not drink any water but only drank [facility].  No abdominal pain no fevers or chills.  Previous hysterectomy.  No recent illness.  Reports she feels just fine now, no complaints.  He has never passed out in the past.  No racing heart no chest pain.  Does not take any birth control, no history of any blood clots, no leg swelling, no recent trips or travel.  Past Medical History:  Diagnosis Date  . Headache     Patient Active Problem List   Diagnosis Date Noted  . Dyspepsia 05/27/2017  . Chronic pelvic pain in female 04/24/2016  . Dysmenorrhea 01/05/2013  . Endometriosis 01/05/2013    Past Surgical History:  Procedure Laterality Date  . CYSTOSCOPY  04/24/2016   Procedure: CYSTOSCOPY;  Surgeon: Gae Dry, MD;  Location: [facility];   Service: Gynecology;;  . DILATION AND CURETTAGE OF UTERUS    . DILITATION & CURRETTAGE/HYSTROSCOPY WITH ESSURE    . LAPAROSCOPIC HYSTERECTOMY Bilateral 04/24/2016   Procedure: HYSTERECTOMY TOTAL LAPAROSCOPIC / BILATERAL SALPINGECTOMY;  Surgeon: Gae Dry, MD;  Location: [facility] OR[facility];  Service: Gynecology;  Laterality: Bilateral;  . LAPAROSCOPIC OVARIAN CYSTECTOMY Right 04/24/2016   Procedure: LAPAROSCOPIC OVARIAN CYSTECTOMY;  Surgeon: Gae Dry, MD;  Location: [facility];  Service: Gynecology;  Laterality: Right;  . SALPINGECTOMY      Prior to Admission medications   Medication Sig Start Date End Date Taking? Authorizing Provider  omeprazole (PRILOSEC) 20 MG capsule Take 1 capsule (20 mg total) by mouth 2 (two) times daily before a meal. Patient not taking: Reported on 10/23/2017 05/27/17 06/26/17  Lin Landsman, MD    Allergies Vicodin [hydrocodone-acetaminophen]  Family History  Problem Relation Age of Onset  . Diabetes Mother   . Hypertension Mother   . Heart attack Father     Social History Social History   Tobacco Use  . Smoking status: Current Every Day Smoker    Packs/day: 1.00    Types: Cigarettes  . Smokeless tobacco: Never Used  Substance Use Topics  . Alcohol use: No  . Drug use: No    Review of Systems Constitutional: No fever/chills Eyes: No visual changes. ENT: No sore throat.  Cardiovascular: Denies chest pain. Respiratory: Denies shortness of breath. Gastrointestinal: No abdominal pain.  No nausea, no vomiting.  Genitourinary: Negative for dysuria.  Urine has been slightly dark for the last 2 days. Musculoskeletal: Negative for back pain. Skin: Negative for rash. Neurological: Negative for headaches, focal weakness or numbness.    ____________________________________________   PHYSICAL EXAM:  VITAL SIGNS: ED Triage Vitals  Enc Vitals Group     BP 10/23/17 0909 (!) 112/57     Pulse Rate 10/23/17 0909 72     Resp 10/23/17 0909 18      Temp 10/23/17 0909 98.2 F (36.8 C)     Temp Source 10/23/17 0909 Oral     SpO2 10/23/17 0909 99 %     Weight 10/23/17 0911 160 lb (72.6 kg)     Height 10/23/17 0911 5\' 3"  (1.6 m)     Head Circumference --      Peak Flow --      Pain Score 10/23/17 0911 4     Pain Loc --      Pain Edu? --      Excl. in [place]? --     Constitutional: Alert and oriented. Well appearing and in no acute distress.  Very pleasant. Eyes: Conjunctivae are normal. Head: Atraumatic. Nose: No congestion/rhinnorhea. Mouth/Throat: Mucous membranes are slightly dry. Neck: No stridor.   Cardiovascular: Normal rate, regular rhythm. Grossly normal heart sounds.  Good peripheral circulation. Respiratory: Normal respiratory effort.  No retractions. Lungs CTAB. Gastrointestinal: Soft and nontender. No distention. Musculoskeletal: No lower extremity tenderness nor edema. Neurologic:  Normal speech and language. No gross focal neurologic deficits are appreciated.  Skin:  Skin is warm, dry and intact. No rash noted. Psychiatric: Mood and affect are normal. Speech and behavior are normal.  ____________________________________________   LABS (all labs ordered are listed, but only abnormal results are displayed)  Labs Reviewed  CBC - Abnormal; Notable for the following components:      Result Value   WBC 15.6 (*)    All other components within normal limits  URINALYSIS, COMPLETE (UACMP) WITH MICROSCOPIC - Abnormal; Notable for the following components:   Color, Urine YELLOW (*)    APPearance HAZY (*)    Bacteria, UA RARE (*)    Squamous Epithelial / LPF 0-5 (*)    All other components within normal limits  URINE CULTURE  BASIC METABOLIC PANEL  CBG MONITORING, ED   ____________________________________________  EKG  ED ECG REPORT I, Delman Kitten, the attending physician, personally viewed and interpreted this ECG.  Date: 10/23/2017 EKG Time: 9:30 AM Rate: 75 Rhythm: normal sinus rhythm QRS Axis:  normal Intervals: normal ST/T Wave abnormalities: normal Narrative Interpretation: no evidence of acute ischemia  ____________________________________________  RADIOLOGY  No indication for imaging   ____________________________________________   PROCEDURES  Procedure(s) performed: None  Procedures  Critical Care performed: No  ____________________________________________   INITIAL IMPRESSION / ASSESSMENT AND PLAN / ED COURSE  Pertinent labs & imaging results that were available during my care of the patient were reviewed by me and considered in my medical decision making (see chart for details).  Near syncope.  Patient now fully recovered.  Neurologically intact.  No ongoing complaints.  Clinical history with recent dark urine and poor fluid intake seem to suggest likely dehydration/vasovagal in nature.  She did not pass out.  Had no associated cardiac or pulmonary symptoms.  The patient is PERC negative.      Pulmonary Embolism Rule-out Criteria (PERC rule)  If YES to ANY of the following, the PERC rule is not satisfied and cannot be used to rule out PE in this patient (consider d-dimer or imaging depending on pre-test probability).                      If NO to ALL of the following, AND the clinician's pre-test probability is <15%, the [facility] rule is satisfied and there is no need for further workup (including no need to obtain a d-dimer) as the post-test probability of pulmonary embolism is <2%.                      Mnemonic is HAD CLOTS   H - hormone use (exogenous estrogen)      No. A - age > 50                                                 No. D - DVT/PE history                                      No.   C - coughing blood (hemoptysis)                 No. L - leg swelling, unilateral                             No. O - O2 Sat on Room Air < 95%                  No. T - tachycardia (HR ? 100)                         No. S - surgery or  trauma, recent                      No.   Based on my evaluation of the patient, including application of this decision instrument, further testing to evaluate for pulmonary embolism is not indicated at this time.  Vitals:   10/23/17 1006 10/23/17 1007  BP: 117/65 119/65  Pulse: 82 77  Resp: 18   Temp:    SpO2:  97%   Patient asymptomatic at present.  We will allow her to eat, hydrate, and reevaluate thereafter but suspect likely dehydration.  Counseled patient on need for hydration, increased water intake and decreased Mountain Dew intake, careful signs and symptoms and return precautions advised.       ____________________________________________   FINAL CLINICAL IMPRESSION(S) / ED DIAGNOSES  Final diagnoses:  Near syncope  Mild dehydration      NEW MEDICATIONS STARTED DURING THIS VISIT:  New Prescriptions   No medications on file     Note:  This document was prepared using Dragon voice recognition software and may include unintentional dictation errors.     Delman Kitten, MD 10/23/17 1115

## 2017-10-23 NOTE — ED Triage Notes (Addendum)
Pt had possible syncopal episode at home, c/o lightheadness and dizziness, visitor with patient states the patient was pale at home. Pt also c/o headache.

## 2017-10-25 LAB — URINE CULTURE: Special Requests: NORMAL

## 2017-10-26 ENCOUNTER — Encounter: Payer: Self-pay | Admitting: Medical Oncology

## 2017-10-26 ENCOUNTER — Emergency Department
Admission: EM | Admit: 2017-10-26 | Discharge: 2017-10-26 | Disposition: A | Discharge status: Home / Self Care | Payer: Self-pay | Attending: Emergency Medicine | Admitting: Emergency Medicine

## 2017-10-26 DIAGNOSIS — R42 Dizziness and giddiness: Secondary | ICD-10-CM | POA: Insufficient documentation

## 2017-10-26 DIAGNOSIS — F17210 Nicotine dependence, cigarettes, uncomplicated: Secondary | ICD-10-CM | POA: Insufficient documentation

## 2017-10-26 LAB — CBC
HCT: 41.8 % (ref 35.0–47.0)
Hemoglobin: 14.2 g/dL (ref 12.0–16.0)
MCH: 31.3 pg (ref 26.0–34.0)
MCHC: 34 g/dL (ref 32.0–36.0)
MCV: 92.2 fL (ref 80.0–100.0)
PLATELETS: 336 10*3/uL (ref 150–440)
RBC: 4.53 MIL/uL (ref 3.80–5.20)
RDW: 12.9 % (ref 11.5–14.5)
WBC: 13 10*3/uL — ABNORMAL HIGH (ref 3.6–11.0)

## 2017-10-26 LAB — BASIC METABOLIC PANEL
Anion gap: 7 (ref 5–15)
BUN: 8 mg/dL (ref 6–20)
CALCIUM: 9.1 mg/dL (ref 8.9–10.3)
CO2: 25 mmol/L (ref 22–32)
CREATININE: 0.59 mg/dL (ref 0.44–1.00)
Chloride: 108 mmol/L (ref 101–111)
GLUCOSE: 97 mg/dL (ref 65–99)
Potassium: 3.5 mmol/L (ref 3.5–5.1)
Sodium: 140 mmol/L (ref 135–145)

## 2017-10-26 LAB — POCT PREGNANCY, URINE: Preg Test, Ur: NEGATIVE

## 2017-10-26 LAB — URINALYSIS, COMPLETE (UACMP) WITH MICROSCOPIC
Bacteria, UA: NONE SEEN
Bilirubin Urine: NEGATIVE
Glucose, UA: NEGATIVE mg/dL
Hgb urine dipstick: NEGATIVE
Ketones, ur: NEGATIVE mg/dL
Leukocytes, UA: NEGATIVE
NITRITE: NEGATIVE
PH: 6 (ref 5.0–8.0)
Protein, ur: NEGATIVE mg/dL
SPECIFIC GRAVITY, URINE: 1.013 (ref 1.005–1.030)

## 2017-10-26 MED ORDER — MECLIZINE HCL 25 MG PO TABS: 25.0000 mg | ORAL_TABLET | Freq: Three times a day (TID) | ORAL | 0 refills | Status: AC | PRN

## 2017-10-26 MED ORDER — MECLIZINE HCL 25 MG PO TABS
25.0000 mg | ORAL_TABLET | Freq: Once | ORAL | Status: AC
  Administered 2017-10-26: 25 mg via ORAL
  Filled 2017-10-26 (×2): qty 1

## 2017-10-26 NOTE — Discharge Instructions (Signed)
Your workup in the Emergency Department today was reassuring.  We did not find any specific abnormalities.  We recommend you drink plenty of fluids, take your regular medications and/or any new ones prescribed today, and follow up with the doctor(s) listed in these documents as recommended.  Return to the Emergency Department if you develop new or worsening symptoms that concern you.  

## 2017-10-26 NOTE — ED Triage Notes (Signed)
FIRST NURSE NOTE-here Friday for dehydration per pt and not feeling any better. ambulatory without difficulty. Declined wheelchair.

## 2017-10-26 NOTE — ED Triage Notes (Signed)
Pt reports she was seen here Friday for dizziness and was told that she was dehydrated. Pt denies vomiting/diarrhea. States that she has been drinking plenty but continues to feel dizzy/light headed. Reports headache.

## 2017-10-26 NOTE — ED Provider Notes (Signed)
[facility] Emergency Department Provider Note  ____________________________________________   First MD Initiated Contact with Patient 10/26/17 1026     (approximate)  I have reviewed the triage vital signs and the nursing notes.   HISTORY  Chief Complaint Dizziness    HPI Sylvia George is a 32 y.o. female with chronic medical issues as listed below who presents for evaluation of persistent intermittent lightheadedness with change of position.  She was seen in the emergency department 3 days ago for near syncope and lightheadedness.  Her evaluation was reassuring and she reported to the provider at that time that she has not been eating or drinking well and that her urine has been quite dark.  There is no evidence of acute infection and no focal neurological deficits and she felt better after fluids.  She returns today because she says that she still intermittently feels some of the lightheadedness.  Sometimes it feels like her head is moving when it is not.  She has had no nausea, vomiting, photophobia, neck pain, neck stiffness, chest pain, shortness of breath, abdominal pain, nor dysuria.  She says that the symptoms are "bad" when they happen but currently she is asymptomatic and she is sitting comfortably in her bed texting on her phone in no distress.  She says that she usually feels the lightheadedness when she stands up or changes position and that sitting back down or lying down helps.  She has not been taking any medications but thought she should get checked out since her symptoms are continuing.  He has been drinking extra fluid including Gatorade and has a half empty bottle of Gatorade with her at this time.  She is not having any difficulty keeping down p.o.  Past Medical History:  Diagnosis Date  . Headache     Patient Active Problem List   Diagnosis Date Noted  . Dyspepsia 05/27/2017  . Chronic pelvic pain in female 04/24/2016  .  Dysmenorrhea 01/05/2013  . Endometriosis 01/05/2013    Past Surgical History:  Procedure Laterality Date  . CYSTOSCOPY  04/24/2016   Procedure: CYSTOSCOPY;  Surgeon: Gae Dry, MD;  Location: [facility] ORS;  Service: Gynecology;;  . DILATION AND CURETTAGE OF UTERUS    . DILITATION & CURRETTAGE/HYSTROSCOPY WITH ESSURE    . LAPAROSCOPIC HYSTERECTOMY Bilateral 04/24/2016   Procedure: HYSTERECTOMY TOTAL LAPAROSCOPIC / BILATERAL SALPINGECTOMY;  Surgeon: Gae Dry, MD;  Location: [facility] ORS;  Service: Gynecology;  Laterality: Bilateral;  . LAPAROSCOPIC OVARIAN CYSTECTOMY Right 04/24/2016   Procedure: LAPAROSCOPIC OVARIAN CYSTECTOMY;  Surgeon: Gae Dry, MD;  Location: [facility] ORS;  Service: Gynecology;  Laterality: Right;  . SALPINGECTOMY      Prior to Admission medications   Medication Sig Start Date End Date Taking? Authorizing Provider  meclizine (ANTIVERT) 25 MG tablet Take 1 tablet (25 mg total) by mouth 3 (three) times daily as needed for dizziness. 10/26/17   Hinda Kehr, MD  omeprazole (PRILOSEC) 20 MG capsule Take 1 capsule (20 mg total) by mouth 2 (two) times daily before a meal. Patient not taking: Reported on 10/23/2017 05/27/17 06/26/17  Lin Landsman, MD    Allergies Vicodin [hydrocodone-acetaminophen]  Family History  Problem Relation Age of Onset  . Diabetes Mother   . Hypertension Mother   . Heart attack Father     Social History Social History   Tobacco Use  . Smoking status: Current Every Day Smoker    Packs/day: 1.00    Types: Cigarettes  .  Smokeless tobacco: Never Used  Substance Use Topics  . Alcohol use: No  . Drug use: No    Review of Systems Constitutional: No fever/chills Eyes: No visual changes. ENT: No sore throat. Cardiovascular: Denies chest pain. Respiratory: Denies shortness of breath. Gastrointestinal: No abdominal pain.  No nausea, no vomiting.  No diarrhea.  No constipation. Genitourinary: Negative for  dysuria. Musculoskeletal: Negative for neck pain.  Negative for back pain. Integumentary: Negative for rash. Neurological: Negative for headaches, focal weakness or numbness.   ____________________________________________   PHYSICAL EXAM:  VITAL SIGNS: ED Triage Vitals [10/26/17 0926]  Enc Vitals Group     BP (!) 115/59     Pulse Rate 72     Resp 16     Temp 98.4 F (36.9 C)     Temp Source Oral     SpO2 99 %     Weight 72.6 kg (160 lb)     Height 1.6 m (5\' 3" )     Head Circumference      Peak Flow      Pain Score 6     Pain Loc      Pain Edu?      Excl. in [place]?     Constitutional: Alert and oriented. Well appearing and in no acute distress. Eyes: Conjunctivae are normal. PERRL. EOMI. Head: Atraumatic. Ears:  Healthy appearing ear canals and TMs bilaterally Nose: No congestion/rhinnorhea. Mouth/Throat: Mucous membranes are moist. Neck: No stridor.  No meningeal signs.   Cardiovascular: Normal rate, regular rhythm. Good peripheral circulation. Grossly normal heart sounds. Respiratory: Normal respiratory effort.  No retractions. Lungs CTAB. Gastrointestinal: Soft and nontender. No distention.  Musculoskeletal: No lower extremity tenderness nor edema. No gross deformities of extremities. Neurologic:  Normal speech and language. No gross focal neurologic deficits are appreciated.  She has normal grip strength bilaterally, good strength in major muscle groups of upper and lower extremities, and was able to stand up and ambulate across the room and back with no ataxia, no imbalance, no hesitation.   Skin:  Skin is warm, dry and intact. No rash noted. Psychiatric: Mood and affect are normal. Speech and behavior are normal.  ____________________________________________   LABS (all labs ordered are listed, but only abnormal results are displayed)  Labs Reviewed  CBC - Abnormal; Notable for the following components:      Result Value   WBC 13.0 (*)    All other components  within normal limits  URINALYSIS, COMPLETE (UACMP) WITH MICROSCOPIC - Abnormal; Notable for the following components:   Color, Urine YELLOW (*)    APPearance CLEAR (*)    Squamous Epithelial / LPF 0-5 (*)    All other components within normal limits  BASIC METABOLIC PANEL  POC URINE PREG, ED  POCT PREGNANCY, URINE   ____________________________________________  EKG  ED ECG REPORT I, Hinda Kehr, the attending physician, personally viewed and interpreted this ECG.  Date: 10/26/2017 EKG Time: 9:34 Rate: 59 Rhythm: borderline bradycardia QRS Axis: normal Intervals: normal ST/T Wave abnormalities: normal Narrative Interpretation: no evidence of acute ischemia  ____________________________________________  RADIOLOGY   No results found.  ____________________________________________   PROCEDURES  Critical Care performed: No   Procedure(s) performed:   Procedures   ____________________________________________   INITIAL IMPRESSION / ASSESSMENT AND PLAN / ED COURSE  As part of my medical decision making, I reviewed the following data within the [place] History obtained from family, Nursing notes reviewed and incorporated, Labs reviewed , EKG interpreted ,  Old chart reviewed and Notes from prior ED visits    Differential diagnosis includes, but is not limited to, volume depletion, benign positional vertigo, CVA/TIA, insufficient posterior circulation, musculoskeletal disorder, migraine.  However the patient has no headache and is very well-appearing overall.  She is not having any issues tolerating p.o. intake.  She is neurologically intact with no evidence of any focal neurological deficits.  I had my usual and customary dizziness discussion with the patient but I explained that with the absence of any focal neurological deficits I think that testing would be unnecessary and lead to posterior.  I did consider an MRI but given that she is completely  neurologically intact and her symptoms are more consistent with orthostatic hypotension and volume depletion, do not think it is indicated at this time.  The patient heard that meclizine might help and I have ordered 1 dose for her today.  She is comfortable with the plan for outpatient follow-up and I gave my usual customary return precautions.     ____________________________________________  FINAL CLINICAL IMPRESSION(S) / ED DIAGNOSES  Final diagnoses:  Lightheadedness     MEDICATIONS GIVEN DURING THIS VISIT:  Medications  meclizine (ANTIVERT) tablet 25 mg (25 mg Oral Given 10/26/17 1048)     ED Discharge Orders        Ordered    meclizine (ANTIVERT) 25 MG tablet  3 times daily PRN     10/26/17 1057       Note:  This document was prepared using Dragon voice recognition software and may include unintentional dictation errors.    Hinda Kehr, MD 10/26/17 1109

## 2017-10-26 NOTE — ED Notes (Signed)
Pt states she was here Friday for lightheaded and dizziness. Told she was "severely dehydrated" state she has been drinking water and gatorade. Continues with light headed and dizziness. Denies syncope. States anterior HA. Denies blurred vision. Alert, oriented, ambulatory to treatment room without difficulty.

## 2018-03-08 ENCOUNTER — Emergency Department: Payer: Self-pay

## 2018-03-08 ENCOUNTER — Emergency Department
Admission: EM | Admit: 2018-03-08 | Discharge: 2018-03-08 | Disposition: A | Discharge status: Home / Self Care | Payer: Self-pay | Attending: Emergency Medicine | Admitting: Emergency Medicine

## 2018-03-08 ENCOUNTER — Other Ambulatory Visit: Payer: Self-pay

## 2018-03-08 DIAGNOSIS — R519 Headache, unspecified: Secondary | ICD-10-CM

## 2018-03-08 DIAGNOSIS — R202 Paresthesia of skin: Secondary | ICD-10-CM | POA: Insufficient documentation

## 2018-03-08 DIAGNOSIS — Z79899 Other long term (current) drug therapy: Secondary | ICD-10-CM | POA: Insufficient documentation

## 2018-03-08 DIAGNOSIS — F17210 Nicotine dependence, cigarettes, uncomplicated: Secondary | ICD-10-CM | POA: Insufficient documentation

## 2018-03-08 DIAGNOSIS — R200 Anesthesia of skin: Secondary | ICD-10-CM

## 2018-03-08 DIAGNOSIS — R51 Headache: Secondary | ICD-10-CM | POA: Insufficient documentation

## 2018-03-08 LAB — BASIC METABOLIC PANEL
ANION GAP: 7 (ref 5–15)
BUN: 8 mg/dL (ref 6–20)
CHLORIDE: 106 mmol/L (ref 101–111)
CO2: 24 mmol/L (ref 22–32)
Calcium: 9.2 mg/dL (ref 8.9–10.3)
Creatinine, Ser: 0.66 mg/dL (ref 0.44–1.00)
GFR calc non Af Amer: >60 mL/min (ref >60)
Glucose, Bld: 87 mg/dL (ref 65–99)
POTASSIUM: 3.6 mmol/L (ref 3.5–5.1)
Sodium: 137 mmol/L (ref 135–145)

## 2018-03-08 LAB — CBC
HCT: 42.3 % (ref 35.0–47.0)
HEMOGLOBIN: 14.6 g/dL (ref 12.0–16.0)
MCH: 32.2 pg (ref 26.0–34.0)
MCHC: 34.5 g/dL (ref 32.0–36.0)
MCV: 93.4 fL (ref 80.0–100.0)
Platelets: 282 10*3/uL (ref 150–440)
RBC: 4.53 MIL/uL (ref 3.80–5.20)
RDW: 12.9 % (ref 11.5–14.5)
WBC: 14.8 10*3/uL — ABNORMAL HIGH (ref 3.6–11.0)

## 2018-03-08 MED ORDER — GADOBENATE DIMEGLUMINE 529 MG/ML IV SOLN
15.0000 mL | Freq: Once | INTRAVENOUS | Status: AC | PRN
  Administered 2018-03-08: 15 mL via INTRAVENOUS
  Filled 2018-03-08: qty 15

## 2018-03-08 MED ORDER — KETOROLAC TROMETHAMINE 30 MG/ML IJ SOLN
30.0000 mg | Freq: Once | INTRAMUSCULAR | Status: AC
  Administered 2018-03-08: 30 mg via INTRAVENOUS
  Filled 2018-03-08: qty 1

## 2018-03-08 MED ORDER — PROCHLORPERAZINE MALEATE 10 MG PO TABS: 10.0000 mg | ORAL_TABLET | Freq: Four times a day (QID) | ORAL | 0 refills | Status: AC | PRN

## 2018-03-08 MED ORDER — PROCHLORPERAZINE EDISYLATE 10 MG/2ML IJ SOLN
10.0000 mg | Freq: Once | INTRAMUSCULAR | Status: AC
  Administered 2018-03-08: 10 mg via INTRAVENOUS
  Filled 2018-03-08: qty 2

## 2018-03-08 MED ORDER — KETOROLAC TROMETHAMINE 10 MG PO TABS: 10.0000 mg | ORAL_TABLET | Freq: Three times a day (TID) | ORAL | 0 refills | Status: AC | PRN

## 2018-03-08 MED ORDER — SODIUM CHLORIDE 0.9 % IV BOLUS
1000.0000 mL | Freq: Once | INTRAVENOUS | Status: AC
  Administered 2018-03-08: 1000 mL via INTRAVENOUS

## 2018-03-08 MED ORDER — DEXAMETHASONE SODIUM PHOSPHATE 10 MG/ML IJ SOLN
10.0000 mg | Freq: Once | INTRAMUSCULAR | Status: AC
  Administered 2018-03-08: 10 mg via INTRAVENOUS
  Filled 2018-03-08: qty 1

## 2018-03-08 NOTE — ED Provider Notes (Addendum)
[facility] Emergency Department Provider Note  ____________________________________________  Time seen: Approximately 3:50 PM  I have reviewed the triage vital signs and the nursing notes.   HISTORY  Chief Complaint Headache    HPI DAYTONA Sylvia George is a 32 y.o. female history of occasional headaches presenting for headache.  The patient reports that yesterday she began to have a left-sided headache which now has progressed "all over."  She did have a sharp pain in the left parietal lobe yesterday the last 1 second and completely resolved.  Today, the patient began to develop a tingling sensation on the left face.  She has not had any visual changes, speech changes, weakness, or difficulty walking.  She has not had any nausea or vomiting, tick bites, fevers or chills, neck stiffness.  No trauma.  The patient has tried Excedrin Migraine and Motrin twice, without any improvement.  She is status post hysterectomy and does not take any hormonal supplementation.  She is a cigarette smoker.  FH: Maternal aunt with lupus; maternal aunt with MI in her 97s.  Past Medical History:  Diagnosis Date  . Headache     Patient Active Problem List   Diagnosis Date Noted  . Dyspepsia 05/27/2017  . Chronic pelvic pain in female 04/24/2016  . Dysmenorrhea 01/05/2013  . Endometriosis 01/05/2013    Past Surgical History:  Procedure Laterality Date  . CYSTOSCOPY  04/24/2016   Procedure: CYSTOSCOPY;  Surgeon: Gae Dry, MD;  Location: [facility] ORS;  Service: Gynecology;;  . DILATION AND CURETTAGE OF UTERUS    . DILITATION & CURRETTAGE/HYSTROSCOPY WITH ESSURE    . LAPAROSCOPIC HYSTERECTOMY Bilateral 04/24/2016   Procedure: HYSTERECTOMY TOTAL LAPAROSCOPIC / BILATERAL SALPINGECTOMY;  Surgeon: Gae Dry, MD;  Location: [facility] ORS;  Service: Gynecology;  Laterality: Bilateral;  . LAPAROSCOPIC OVARIAN CYSTECTOMY Right 04/24/2016   Procedure: LAPAROSCOPIC OVARIAN CYSTECTOMY;   Surgeon: Gae Dry, MD;  Location: [facility] ORS;  Service: Gynecology;  Laterality: Right;  . SALPINGECTOMY      Current Outpatient Rx  . Order #: [id number] Class: Print  . Order #: [id number] Class: Normal    Allergies Vicodin [hydrocodone-acetaminophen]  Family History  Problem Relation Age of Onset  . Diabetes Mother   . Hypertension Mother   . Heart attack Father     Social History Social History   Tobacco Use  . Smoking status: Current Every Day Smoker    Packs/day: 1.00    Types: Cigarettes  . Smokeless tobacco: Never Used  Substance Use Topics  . Alcohol use: No  . Drug use: No    Review of Systems Constitutional: No fever/chills. No trauma, tick bites, lightheadedness or syncope. Eyes: No visual changes.  No blurred or double vision.  No photophobia. ENT: No sore throat. No congestion or rhinorrhea.  No dental abscess. Cardiovascular: Denies chest pain. Denies palpitations. Respiratory: Denies shortness of breath.  No cough. Gastrointestinal: No abdominal pain.  No nausea, no vomiting.  No diarrhea.  No constipation. Genitourinary: Negative for dysuria. Musculoskeletal: Negative for back pain. Skin: Negative for rash. Neurological: Positive left-sided headache.  Positive tingling of the left face.  No numbness or weakness.  No difficulty walking.  No changes in speech or vision.  No changes in mental status    ____________________________________________   PHYSICAL EXAM:  VITAL SIGNS: ED Triage Vitals  Enc Vitals Group     BP 03/08/18 1324 115/61     Pulse Rate 03/08/18 1324 74     Resp 03/08/18 1324  16     Temp 03/08/18 1324 98.2 F (36.8 C)     Temp Source 03/08/18 1324 Oral     SpO2 03/08/18 1324 98 %     Weight 03/08/18 1325 160 lb (72.6 kg)     Height 03/08/18 1325 5\' 3"  (1.6 m)     Head Circumference --      Peak Flow --      Pain Score 03/08/18 1324 8     Pain Loc --      Pain Edu? --      Excl. in [place]? --     Constitutional: Alert  and oriented. Well appearing and in no acute distress. Answers questions appropriately. Eyes: Conjunctivae are normal.  EOMI. PERRLA.  No photosensitivity.  No horizontal or vertical nystagmus.  No scleral icterus. Head: Atraumatic.  No pain over the temples. Nose: No congestion/rhinnorhea. Mouth/Throat: Mucous membranes are moist.  Neck: No stridor.  Supple.  No JVD.  No meningismus. Cardiovascular: Normal rate, regular rhythm. No murmurs, rubs or gallops.  Respiratory: Normal respiratory effort.  No accessory muscle use or retractions. Lungs CTAB.  No wheezes, rales or ronchi. Gastrointestinal: Soft, nontender and nondistended.  No guarding or rebound.  No peritoneal signs. Musculoskeletal: No LE edema. No ttp in the calves or palpable cords.  Negative Homan's sign. Neurologic: Alert and oriented 3. Speech is clear. Face and smile symmetric. Tongue is midline.  EOMI.  PERRLA.  No horizontal or vertical nystagmus. No pronator drift. 5 out of 5 grip, biceps, triceps, hip flexors, plantar flexion and dorsiflexion. Normal sensation to light touch in the bilateral upper and lower extremities.  Decreased sensation or sensation of "tingling" when I touch the left side of the face diffusely.  Normal heel-to-shin.  Normal gait without ataxia. Skin:  Skin is warm, dry and intact. No rash noted. Psychiatric: Mood and affect are normal. Speech and behavior are normal.  Normal judgement.  ____________________________________________   LABS (all labs ordered are listed, but only abnormal results are displayed)  Labs Reviewed  CBC - Abnormal; Notable for the following components:      Result Value   WBC 14.8 (*)    All other components within normal limits  BASIC METABOLIC PANEL   ____________________________________________  EKG  ED ECG REPORT I, Eula Listen, the attending physician, personally viewed and interpreted this ECG.   Date: 03/08/2018  EKG Time: 1453  Rate: 70  Rhythm:  normal sinus rhythm  Axis: normal  Intervals:none  ST&T Change: no STEMI  ____________________________________________  RADIOLOGY  No results found.  ____________________________________________   PROCEDURES  Procedure(s) performed: None  Procedures  Critical Care performed: No ____________________________________________   INITIAL IMPRESSION / ASSESSMENT AND PLAN / ED COURSE  Pertinent labs & imaging results that were available during my care of the patient were reviewed by me and considered in my medical decision making (see chart for details).  32 y.o. female, presenting with 2 days of headache that is refractory to her usual headache treatment, now with tingling of the left face.  Overall, the patient is hemodynamically stable.  On exam she does have tingling in the left face but otherwise no other focal neurologic deficit.  She likely has a migraine or an atypical migraine.  Will initiate symptomatic treatment for this.  Her electrolytes are reassuring.  She has a white blood cell count of 14.8 but does have a chronically elevated white count.  Other etiologies which would be much less likely but are considered  include intracranial mass, CVA as the patient does have some family history of early arterial disease, and autoimmune process including MS or lupus.  Venous sinus thrombosis is very unlikely but also considered.  Plan reevaluation for final disposition.  ----------------------------------------- 4:48 PM on 03/08/2018 -----------------------------------------  I have spoken with Dr. Doy Mince, the neurologist on-call.  Given the differential diagnosis in this patient, we will discontinue the order for CT, and go straight to MRI of the brain.  If her work-up is negative at that point, she will be discharged home with outpatient neurologic follow-up.  ----------------------------------------- 10:20 PM on 03/08/2018 -----------------------------------------  Patient's  work-up in the emergency department has been reassuring.  Her MRI brain did not show any acute intracranial abnormality.  Her laboratory studies are otherwise reassuring.  At this time, the patient will be discharged home.  Give her follow-up instructions to see her primary care physician and the neurologist.  She understands return precautions. ____________________________________________  FINAL CLINICAL IMPRESSION(S) / ED DIAGNOSES  Final diagnoses:  None    Clinical Course as of Mar 09 2219  Mon Mar 08, 2018  1832 Patient is feeling significantly better and has been able to ambulate without any difficulty.   [AN]    Clinical Course User Index [AN] Eula Listen, MD      NEW MEDICATIONS STARTED DURING THIS VISIT:  New Prescriptions   No medications on file      Eula Listen, MD 03/08/18 2220    Eula Listen, MD 03/08/18 2221

## 2018-03-08 NOTE — ED Notes (Signed)
MRI called and informed me that it would be about an hour before they got to paitent

## 2018-03-08 NOTE — ED Triage Notes (Signed)
Pt reports headache x2 days with no relief from OTC meds - c/o left side of face is tingling since yesterday afternoon

## 2018-03-08 NOTE — ED Notes (Signed)
Pt woken up to undress for MRI. Logan from MRI stated he would pick pt up an take her to MRI in a few minutes. Pt informed to take all jewelry and clothes off and wear a hospital gown.

## 2018-03-08 NOTE — Discharge Instructions (Addendum)
Please return to the emergency department if you develop severe pain, visual changes, speech changes, numbness tingling or weakness, or any other symptoms concerning to you.

## 2018-03-08 NOTE — ED Notes (Signed)
Pt returned from MRI via stretcher.

## 2018-03-08 NOTE — ED Notes (Signed)
This RN called MRI for update when pt is getting MRI done. Spoke to Hoffman who stated that she should be able to go to MRI in about 20 minutes.

## 2018-04-02 ENCOUNTER — Encounter: Payer: Self-pay | Admitting: Emergency Medicine

## 2018-04-02 ENCOUNTER — Emergency Department: Payer: Self-pay

## 2018-04-02 ENCOUNTER — Other Ambulatory Visit: Payer: Self-pay

## 2018-04-02 ENCOUNTER — Emergency Department
Admission: EM | Admit: 2018-04-02 | Discharge: 2018-04-02 | Disposition: A | Discharge status: Home / Self Care | Payer: Self-pay | Attending: Emergency Medicine | Admitting: Emergency Medicine

## 2018-04-02 DIAGNOSIS — S8991XA Unspecified injury of right lower leg, initial encounter: Secondary | ICD-10-CM | POA: Insufficient documentation

## 2018-04-02 DIAGNOSIS — Z79899 Other long term (current) drug therapy: Secondary | ICD-10-CM | POA: Insufficient documentation

## 2018-04-02 DIAGNOSIS — Y999 Unspecified external cause status: Secondary | ICD-10-CM | POA: Insufficient documentation

## 2018-04-02 DIAGNOSIS — F17210 Nicotine dependence, cigarettes, uncomplicated: Secondary | ICD-10-CM | POA: Insufficient documentation

## 2018-04-02 DIAGNOSIS — W010XXA Fall on same level from slipping, tripping and stumbling without subsequent striking against object, initial encounter: Secondary | ICD-10-CM | POA: Insufficient documentation

## 2018-04-02 DIAGNOSIS — Y92007 Garden or yard of unspecified non-institutional (private) residence as the place of occurrence of the external cause: Secondary | ICD-10-CM | POA: Insufficient documentation

## 2018-04-02 DIAGNOSIS — Y9311 Activity, swimming: Secondary | ICD-10-CM | POA: Insufficient documentation

## 2018-04-02 MED ORDER — IBUPROFEN 600 MG PO TABS: 600.0000 mg | ORAL_TABLET | Freq: Four times a day (QID) | ORAL | 0 refills | Status: DC | PRN

## 2018-04-02 NOTE — ED Triage Notes (Signed)
Pt comes into the ED via POV c/o right knee pain after playing on a slip and slide yesterday.  Patient ambulatory to triage at this and in NAD.  No deformity noted to the leg.

## 2018-04-02 NOTE — ED Provider Notes (Signed)
[facility] Emergency Department Provider Note  ____________________________________________  Time seen: Approximately 12:48 PM  I have reviewed the triage vital signs and the nursing notes.   HISTORY  Chief Complaint Knee Pain    HPI Sylvia George is a 32 y.o. female that presents to the emergency department for evaluation of right knee pain after injury yesterday.  Patient was playing on a slip and slide at the barbecue when her knee twisted and she fell.  She did not feel a pop or crack.  She has pain with walking.  She has not taken anything for pain.  No additional injuries.  No numbness, tingling.  Past Medical History:  Diagnosis Date  . Headache     Patient Active Problem List   Diagnosis Date Noted  . Dyspepsia 05/27/2017  . Chronic pelvic pain in female 04/24/2016  . Dysmenorrhea 01/05/2013  . Endometriosis 01/05/2013    Past Surgical History:  Procedure Laterality Date  . CYSTOSCOPY  04/24/2016   Procedure: CYSTOSCOPY;  Surgeon: Gae Dry, MD;  Location: [facility] ORS;  Service: Gynecology;;  . DILATION AND CURETTAGE OF UTERUS    . DILITATION & CURRETTAGE/HYSTROSCOPY WITH ESSURE    . LAPAROSCOPIC HYSTERECTOMY Bilateral 04/24/2016   Procedure: HYSTERECTOMY TOTAL LAPAROSCOPIC / BILATERAL SALPINGECTOMY;  Surgeon: Gae Dry, MD;  Location: [facility] ORS;  Service: Gynecology;  Laterality: Bilateral;  . LAPAROSCOPIC OVARIAN CYSTECTOMY Right 04/24/2016   Procedure: LAPAROSCOPIC OVARIAN CYSTECTOMY;  Surgeon: Gae Dry, MD;  Location: [facility] ORS;  Service: Gynecology;  Laterality: Right;  . SALPINGECTOMY      Prior to Admission medications   Medication Sig Start Date End Date Taking? Authorizing Provider  ibuprofen (ADVIL,MOTRIN) 600 MG tablet Take 1 tablet (600 mg total) by mouth every 6 (six) hours as needed. 04/02/18   Laban Emperor, PA-C  ketorolac (TORADOL) 10 MG tablet Take 1 tablet (10 mg total) by mouth every 8 (eight) hours as  needed for moderate pain (with food). 03/08/18   Eula Listen, MD  meclizine (ANTIVERT) 25 MG tablet Take 1 tablet (25 mg total) by mouth 3 (three) times daily as needed for dizziness. 10/26/17   Hinda Kehr, MD  omeprazole (PRILOSEC) 20 MG capsule Take 1 capsule (20 mg total) by mouth 2 (two) times daily before a meal. Patient not taking: Reported on 10/23/2017 05/27/17 06/26/17  Lin Landsman, MD  prochlorperazine (COMPAZINE) 10 MG tablet Take 1 tablet (10 mg total) by mouth every 6 (six) hours as needed for nausea or vomiting. 03/08/18   Eula Listen, MD    Allergies Vicodin [hydrocodone-acetaminophen]  Family History  Problem Relation Age of Onset  . Diabetes Mother   . Hypertension Mother   . Heart attack Father     Social History Social History   Tobacco Use  . Smoking status: Current Every Day Smoker    Packs/day: 1.00    Types: Cigarettes  . Smokeless tobacco: Never Used  Substance Use Topics  . Alcohol use: No  . Drug use: No     Review of Systems  Constitutional: No fever/chills ENT: No upper respiratory complaints. Cardiovascular: No chest pain. Respiratory: No SOB. Gastrointestinal: No abdominal pain.  No nausea, no vomiting.  Musculoskeletal: Positive for knee pain.  Skin: Negative for rash, abrasions, lacerations, ecchymosis. Neurological: Negative for numbness or tingling   ____________________________________________   PHYSICAL EXAM:  VITAL SIGNS: ED Triage Vitals  Enc Vitals Group     BP 04/02/18 1133 115/72  Pulse Rate 04/02/18 1133 82     Resp 04/02/18 1133 17     Temp 04/02/18 1133 98.1 F (36.7 C)     Temp Source 04/02/18 1133 Oral     SpO2 04/02/18 1133 98 %     Weight 04/02/18 1127 160 lb (72.6 kg)     Height 04/02/18 1127 5\' 3"  (1.6 m)     Head Circumference --      Peak Flow --      Pain Score 04/02/18 1127 6     Pain Loc --      Pain Edu? --      Excl. in [place]? --      Constitutional: Alert and  oriented. Well appearing and in no acute distress. Eyes: Conjunctivae are normal. PERRL. EOMI. Head: Atraumatic. ENT:      Ears:      Nose: No congestion/rhinnorhea.      Mouth/Throat: Mucous membranes are moist.  Neck: No stridor.  Cardiovascular: Normal rate, regular rhythm.  Good peripheral circulation. Respiratory: Normal respiratory effort without tachypnea or retractions. Lungs CTAB. Good air entry to the bases with no decreased or absent breath sounds. Gastrointestinal: Bowel sounds 4 quadrants. Soft and nontender to palpation. No guarding or rigidity. No palpable masses. No distention. Musculoskeletal: Full range of motion to all extremities. No gross deformities appreciated. Neurologic:  Normal speech and language. No gross focal neurologic deficits are appreciated.  Skin:  Skin is warm, dry and intact. No rash noted. Psychiatric: Mood and affect are normal. Speech and behavior are normal. Patient exhibits appropriate insight and judgement.   ____________________________________________   LABS (all labs ordered are listed, but only abnormal results are displayed)  Labs Reviewed - No data to display ____________________________________________  EKG   ____________________________________________  RADIOLOGY Robinette Haines, personally viewed and evaluated these images (plain radiographs) as part of my medical decision making, as well as reviewing the written report by the radiologist.  Dg Knee Complete 4 Views Right  Result Date: 04/02/2018 CLINICAL DATA:  Right knee pain.  Status post fall. EXAM: RIGHT KNEE - COMPLETE 4+ VIEW COMPARISON:  04/09/2012 FINDINGS: No acute fracture or dislocation. No significant joint effusion. Mild medial femorotibial compartment osteoarthritis. No aggressive osseous lesion. No soft tissue abnormality. IMPRESSION: 1.  No acute osseous injury of the right knee. 2. 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  Electronically Signed   By: Kathreen Devoid   On: 04/02/2018 12:17    ____________________________________________    PROCEDURES  Procedure(s) performed:    Procedures    Medications - No data to display   ____________________________________________   INITIAL IMPRESSION / ASSESSMENT AND PLAN / ED COURSE  Pertinent labs & imaging results that were available during my care of the patient were reviewed by me and considered in my medical decision making (see chart for details).  Review of the [place] CSRS was performed in accordance of the [place] prior to dispensing any controlled drugs.   Patient presents emergency department for evaluation of right knee pain after injury yesterday.  Symptoms are consistent with sprain.  X-ray negative for acute bony abnormalities.  Patient is requesting a knee immobilizer for today until she can get a knee brace from drugstore.  Crutches were given.  Patient will be discharged home with prescriptions for ibuprofen . Patient is to follow up with PCP as directed. Patient is given ED precautions to return to the ED for any worsening or new symptoms.  ____________________________________________  FINAL CLINICAL IMPRESSION(S) / ED DIAGNOSES  Final diagnoses:  Injury of right knee, initial encounter      NEW MEDICATIONS STARTED DURING THIS VISIT:  ED Discharge Orders        Ordered    Knee brace     04/02/18 1319    ibuprofen (ADVIL,MOTRIN) 600 MG tablet  Every 6 hours PRN     04/02/18 1319          This chart was dictated using voice recognition software/Dragon. Despite best efforts to proofread, errors can occur which can change the meaning. Any change was purely unintentional.    Laban Emperor, PA-C 04/02/18 [place], [place], MD 04/02/18 [phone]

## 2018-04-02 NOTE — ED Notes (Signed)
See triage note  Presents with pain to right knee since yesterday  Was playing on slip-n-slide

## 2018-04-02 NOTE — ED Notes (Signed)
Pt declines crutches

## 2019-03-16 ENCOUNTER — Telehealth: Payer: Self-pay | Admitting: *Deleted

## 2019-03-16 DIAGNOSIS — Z20822 Contact with and (suspected) exposure to covid-19: Secondary | ICD-10-CM

## 2019-03-16 NOTE — Telephone Encounter (Signed)
I called pt and scheduled her for COVID-19 testing for Friday June 19th, 2020 at 8:00 at the [facility] at the [place] location.  I made her aware to stay in the car and wear a mask.  Order entered  No insurance

## 2019-03-18 ENCOUNTER — Other Ambulatory Visit: Payer: Self-pay

## 2019-03-18 DIAGNOSIS — Z20822 Contact with and (suspected) exposure to covid-19: Secondary | ICD-10-CM

## 2019-03-22 LAB — NOVEL CORONAVIRUS, NAA: SARS-CoV-2, NAA: NOT DETECTED

## 2019-07-20 ENCOUNTER — Other Ambulatory Visit: Payer: Self-pay

## 2019-07-20 ENCOUNTER — Emergency Department
Admission: EM | Admit: 2019-07-20 | Discharge: 2019-07-20 | Disposition: A | Discharge status: Home / Self Care | Payer: Self-pay | Attending: Emergency Medicine | Admitting: Emergency Medicine

## 2019-07-20 DIAGNOSIS — E860 Dehydration: Secondary | ICD-10-CM | POA: Insufficient documentation

## 2019-07-20 DIAGNOSIS — R42 Dizziness and giddiness: Secondary | ICD-10-CM | POA: Insufficient documentation

## 2019-07-20 DIAGNOSIS — F17210 Nicotine dependence, cigarettes, uncomplicated: Secondary | ICD-10-CM | POA: Insufficient documentation

## 2019-07-20 LAB — CBC WITH DIFFERENTIAL/PLATELET
Abs Immature Granulocytes: 0.03 10*3/uL (ref 0.00–0.07)
Basophils Absolute: 0 10*3/uL (ref 0.0–0.1)
Basophils Relative: 0 %
Eosinophils Absolute: 0.1 10*3/uL (ref 0.0–0.5)
Eosinophils Relative: 1 %
HCT: 38.3 % (ref 36.0–46.0)
Hemoglobin: 13.1 g/dL (ref 12.0–15.0)
Immature Granulocytes: 0 %
Lymphocytes Relative: 36 %
Lymphs Abs: 3.6 10*3/uL (ref 0.7–4.0)
MCH: 31 pg (ref 26.0–34.0)
MCHC: 34.2 g/dL (ref 30.0–36.0)
MCV: 90.5 fL (ref 80.0–100.0)
Monocytes Absolute: 0.4 10*3/uL (ref 0.1–1.0)
Monocytes Relative: 4 %
Neutro Abs: 5.8 10*3/uL (ref 1.7–7.7)
Neutrophils Relative %: 59 %
Platelets: 324 10*3/uL (ref 150–400)
RBC: 4.23 MIL/uL (ref 3.87–5.11)
RDW: 11.7 % (ref 11.5–15.5)
WBC: 10 10*3/uL (ref 4.0–10.5)
nRBC: 0 % (ref 0.0–0.2)

## 2019-07-20 LAB — BASIC METABOLIC PANEL
Anion gap: 9 (ref 5–15)
BUN: 11 mg/dL (ref 6–20)
CO2: 25 mmol/L (ref 22–32)
Calcium: 9.2 mg/dL (ref 8.9–10.3)
Chloride: 106 mmol/L (ref 98–111)
Creatinine, Ser: 0.74 mg/dL (ref 0.44–1.00)
GFR calc Af Amer: >60 mL/min (ref >60)
GFR calc non Af Amer: >60 mL/min (ref >60)
Glucose, Bld: 97 mg/dL (ref 70–99)
Potassium: 3.7 mmol/L (ref 3.5–5.1)
Sodium: 140 mmol/L (ref 135–145)

## 2019-07-20 LAB — URINALYSIS, COMPLETE (UACMP) WITH MICROSCOPIC
Bacteria, UA: NONE SEEN
Bilirubin Urine: NEGATIVE
Glucose, UA: NEGATIVE mg/dL
Hgb urine dipstick: NEGATIVE
Ketones, ur: NEGATIVE mg/dL
Leukocytes,Ua: NEGATIVE
Nitrite: NEGATIVE
Protein, ur: NEGATIVE mg/dL
Specific Gravity, Urine: 1.004 — ABNORMAL LOW (ref 1.005–1.030)
pH: 6 (ref 5.0–8.0)

## 2019-07-20 LAB — PREGNANCY, URINE: Preg Test, Ur: NEGATIVE

## 2019-07-20 MED ORDER — FAMOTIDINE 20 MG PO TABS
40.0000 mg | ORAL_TABLET | Freq: Once | ORAL | Status: AC
  Administered 2019-07-20: 40 mg via ORAL
  Filled 2019-07-20: qty 2

## 2019-07-20 MED ORDER — ALUM & MAG HYDROXIDE-SIMETH 200-200-20 MG/5ML PO SUSP
30.0000 mL | Freq: Once | ORAL | Status: AC
  Administered 2019-07-20: 30 mL via ORAL
  Filled 2019-07-20: qty 30

## 2019-07-20 MED ORDER — ONDANSETRON 4 MG PO TBDP
8.0000 mg | ORAL_TABLET | Freq: Once | ORAL | Status: AC
  Administered 2019-07-20: 8 mg via ORAL
  Filled 2019-07-20: qty 2

## 2019-07-20 MED ORDER — ONDANSETRON 4 MG PO TBDP: 4.0000 mg | ORAL_TABLET | Freq: Three times a day (TID) | ORAL | 0 refills | Status: AC | PRN

## 2019-07-20 MED ORDER — FAMOTIDINE 20 MG PO TABS: 20.0000 mg | ORAL_TABLET | Freq: Two times a day (BID) | ORAL | 0 refills | Status: AC

## 2019-07-20 MED ORDER — SODIUM CHLORIDE 0.9 % IV BOLUS
1000.0000 mL | Freq: Once | INTRAVENOUS | Status: AC
  Administered 2019-07-20: 1000 mL via INTRAVENOUS

## 2019-07-20 NOTE — ED Provider Notes (Signed)
[facility] Emergency Department Provider Note  ____________________________________________  Time seen: Approximately 11:40 AM  I have reviewed the triage vital signs and the nursing notes.   HISTORY  Chief Complaint Dizziness    HPI QUAN SIERRA is a 33 y.o. female with a history of chronic pelvic pain and endometriosis who complains of nausea and dizziness/lightheadedness for the past 4 days, gradual onset, worse with standing , better sitting and lying down.  She reports poor oral intake over the past week.  States that she gets full easily and does not eat regularly.  Mostly drinks soda for fluids.  Has had issues with dehydration in the past.  Denies any pain complaints.  No fevers chills body aches.  She does occasionally wake up at night with a dry cough.     Past Medical History:  Diagnosis Date  . Headache      Patient Active Problem List   Diagnosis Date Noted  . Dyspepsia 05/27/2017  . Chronic pelvic pain in female 04/24/2016  . Dysmenorrhea 01/05/2013  . Endometriosis 01/05/2013     Past Surgical History:  Procedure Laterality Date  . CYSTOSCOPY  04/24/2016   Procedure: CYSTOSCOPY;  Surgeon: Gae Dry, MD;  Location: [facility] ORS;  Service: Gynecology;;  . DILATION AND CURETTAGE OF UTERUS    . DILITATION & CURRETTAGE/HYSTROSCOPY WITH ESSURE    . LAPAROSCOPIC HYSTERECTOMY Bilateral 04/24/2016   Procedure: HYSTERECTOMY TOTAL LAPAROSCOPIC / BILATERAL SALPINGECTOMY;  Surgeon: Gae Dry, MD;  Location: [facility] ORS;  Service: Gynecology;  Laterality: Bilateral;  . LAPAROSCOPIC OVARIAN CYSTECTOMY Right 04/24/2016   Procedure: LAPAROSCOPIC OVARIAN CYSTECTOMY;  Surgeon: Gae Dry, MD;  Location: [facility] ORS;  Service: Gynecology;  Laterality: Right;  . SALPINGECTOMY       Prior to Admission medications   Medication Sig Start Date End Date Taking? Authorizing Provider  famotidine (PEPCID) 20 MG tablet Take 1 tablet (20 mg total)  by mouth 2 (two) times daily. 07/20/19   Carrie Mew, MD  ketorolac (TORADOL) 10 MG tablet Take 1 tablet (10 mg total) by mouth every 8 (eight) hours as needed for moderate pain (with food). Patient not taking: Reported on 07/20/2019 03/08/18   Eula Listen, MD  meclizine (ANTIVERT) 25 MG tablet Take 1 tablet (25 mg total) by mouth 3 (three) times daily as needed for dizziness. Patient not taking: Reported on 07/20/2019 10/26/17   Hinda Kehr, MD  omeprazole (PRILOSEC) 20 MG capsule Take 1 capsule (20 mg total) by mouth 2 (two) times daily before a meal. Patient not taking: Reported on 10/23/2017 05/27/17 06/26/17  Lin Landsman, MD  ondansetron (ZOFRAN ODT) 4 MG disintegrating tablet Take 1 tablet (4 mg total) by mouth every 8 (eight) hours as needed for nausea or vomiting. 07/20/19   Carrie Mew, MD  prochlorperazine (COMPAZINE) 10 MG tablet Take 1 tablet (10 mg total) by mouth every 6 (six) hours as needed for nausea or vomiting. Patient not taking: Reported on 07/20/2019 03/08/18   Eula Listen, MD     Allergies Vicodin [hydrocodone-acetaminophen]   Family History  Problem Relation Age of Onset  . Diabetes Mother   . Hypertension Mother   . Heart attack Father     Social History Social History   Tobacco Use  . Smoking status: Current Every Day Smoker    Packs/day: 1.00    Types: Cigarettes  . Smokeless tobacco: Never Used  Substance Use Topics  . Alcohol use: No  . Drug use: No  Review of Systems  Constitutional:   No fever or chills.  ENT:   No sore throat. No rhinorrhea. Cardiovascular:   No chest pain or syncope. Respiratory:   No dyspnea or cough. Gastrointestinal:   Negative for abdominal pain, vomiting and diarrhea.  Musculoskeletal:   Negative for focal pain or swelling All other systems reviewed and are negative except as documented above in ROS and HPI.  ____________________________________________   PHYSICAL  EXAM:  VITAL SIGNS: ED Triage Vitals [07/20/19 0733]  Enc Vitals Group     BP (!) 113/48     Pulse Rate 64     Resp 16     Temp 98.3 F (36.8 C)     Temp Source Oral     SpO2 98 %     Weight 168 lb (76.2 kg)     Height 5\' 3"  (1.6 m)     Head Circumference      Peak Flow      Pain Score 0     Pain Loc      Pain Edu?      Excl. in [place]?     Vital signs reviewed, nursing assessments reviewed.   Constitutional:   Alert and oriented. Non-toxic appearance. Eyes:   Conjunctivae are normal. EOMI. PERRL. ENT      Head:   Normocephalic and atraumatic.      Nose:   Wearing a mask.      Mouth/Throat:   Wearing a mask.      Neck:   No meningismus. Full ROM. Hematological/Lymphatic/Immunilogical:   No cervical lymphadenopathy. Cardiovascular:   RRR. Symmetric bilateral radial and DP pulses.  No murmurs. Cap refill less than 2 seconds. Respiratory:   Normal respiratory effort without tachypnea/retractions. Breath sounds are clear and equal bilaterally. No wheezes/rales/rhonchi. Gastrointestinal:   Soft and nontender. Non distended. There is no CVA tenderness.  No rebound, rigidity, or guarding. Musculoskeletal:   Normal range of motion in all extremities. No joint effusions.  No lower extremity tenderness.  No edema. Neurologic:   Normal speech and language.  Motor grossly intact. No acute focal neurologic deficits are appreciated.  Skin:    Skin is warm, dry and intact. No rash noted.  No petechiae, purpura, or bullae.  ____________________________________________    LABS (pertinent positives/negatives) (all labs ordered are listed, but only abnormal results are displayed) Labs Reviewed  URINALYSIS, COMPLETE (UACMP) WITH MICROSCOPIC - Abnormal; Notable for the following components:      Result Value   Color, Urine STRAW (*)    APPearance CLEAR (*)    Specific Gravity, Urine 1.004 (*)    All other components within normal limits  BASIC METABOLIC PANEL  CBC WITH  DIFFERENTIAL/PLATELET  PREGNANCY, URINE   ____________________________________________   EKG    ____________________________________________    RADIOLOGY  No results found.  ____________________________________________   PROCEDURES Procedures  ____________________________________________    CLINICAL IMPRESSION / ASSESSMENT AND PLAN / ED COURSE  Medications ordered in the ED: Medications  ondansetron (ZOFRAN-ODT) disintegrating tablet 8 mg (8 mg Oral Given 07/20/19 0904)  alum & mag hydroxide-simeth (MAALOX/MYLANTA) 200-200-20 MG/5ML suspension 30 mL (30 mLs Oral Given 07/20/19 0905)  famotidine (PEPCID) tablet 40 mg (40 mg Oral Given 07/20/19 0904)  sodium chloride 0.9 % bolus 1,000 mL (0 mLs Intravenous Stopped 07/20/19 1002)    Pertinent labs & imaging results that were available during my care of the patient were reviewed by me and considered in my medical decision making (see chart for  details).  KIMBA RENFREW was evaluated in Emergency Department on 07/20/2019 for the symptoms described in the history of present illness. She was evaluated in the context of the global COVID-19 pandemic, which necessitated consideration that the patient might be at risk for infection with the SARS-CoV-2 virus that causes COVID-19. Institutional protocols and algorithms that pertain to the evaluation of patients at risk for COVID-19 are in a state of rapid change based on information released by regulatory bodies including the CDC and federal and state organizations. These policies and algorithms were followed during the patient's care in the ED.   Patient presents with orthostatic lightheadedness in the setting of poor oral intake.  Exam is reassuring, vital signs are normal.  Presentation is consistent with some underlying GERD that is inhibiting her normal oral intake and contributing to dehydration in combination with her dietary habits.  Patient given IV fluids for hydration,  Maalox Pepcid and Zofran after which she is feeling better and tolerating oral intake.  Requests a work note.  Stable for discharge home.  Recommend that she continue Pepcid at home for treatment of underlying GERD that is likely precipitating these episodes, Zofran as needed.  Considering the patient's symptoms, medical history, and physical examination today, I have low suspicion for cholecystitis or biliary pathology, pancreatitis, perforation or bowel obstruction, hernia, intra-abdominal abscess, AAA or dissection, volvulus or intussusception, mesenteric ischemia, or appendicitis.        ____________________________________________   FINAL CLINICAL IMPRESSION(S) / ED DIAGNOSES    Final diagnoses:  Dehydration  Dizziness     ED Discharge Orders         Ordered    famotidine (PEPCID) 20 MG tablet  2 times daily     07/20/19 1135    ondansetron (ZOFRAN ODT) 4 MG disintegrating tablet  Every 8 hours PRN     07/20/19 1135          Portions of this note were generated with dragon dictation software. Dictation errors may occur despite best attempts at proofreading.   Carrie Mew, MD 07/20/19 1143

## 2019-07-20 NOTE — ED Triage Notes (Signed)
Pt c/o dizziness and nausea since Saturday. Denies vomiting or diarrhea  States she has had issues with dizziness in the past and was told she was dehydrated. Denies any pain.

## 2019-07-20 NOTE — ED Notes (Signed)
Pt provided graham crackers and water for PO challenge.

## 2019-07-20 NOTE — ED Notes (Signed)
Pt verbalized understanding of discharge instructions. NAD at this time. 

## 2019-08-22 ENCOUNTER — Encounter: Payer: Self-pay | Admitting: Emergency Medicine

## 2019-08-22 ENCOUNTER — Emergency Department
Admission: EM | Admit: 2019-08-22 | Discharge: 2019-08-22 | Disposition: A | Discharge status: Home / Self Care | Payer: Self-pay | Attending: Emergency Medicine | Admitting: Emergency Medicine

## 2019-08-22 ENCOUNTER — Other Ambulatory Visit: Payer: Self-pay

## 2019-08-22 DIAGNOSIS — F17210 Nicotine dependence, cigarettes, uncomplicated: Secondary | ICD-10-CM | POA: Insufficient documentation

## 2019-08-22 DIAGNOSIS — N390 Urinary tract infection, site not specified: Secondary | ICD-10-CM | POA: Insufficient documentation

## 2019-08-22 LAB — URINALYSIS, COMPLETE (UACMP) WITH MICROSCOPIC
Bilirubin Urine: NEGATIVE
Glucose, UA: NEGATIVE mg/dL
Hgb urine dipstick: NEGATIVE
Ketones, ur: NEGATIVE mg/dL
Nitrite: NEGATIVE
Protein, ur: NEGATIVE mg/dL
Specific Gravity, Urine: 1.013 (ref 1.005–1.030)
pH: 7 (ref 5.0–8.0)

## 2019-08-22 MED ORDER — CEPHALEXIN 500 MG PO CAPS: 500.0000 mg | ORAL_CAPSULE | Freq: Three times a day (TID) | ORAL | 0 refills | Status: AC

## 2019-08-22 NOTE — Discharge Instructions (Addendum)
Follow up with your regular doctor or the health department if your std test comes back positive.  Return if worsening

## 2019-08-22 NOTE — ED Provider Notes (Signed)
ua positive for leuks Pt dx with uti rx for keflex 500 tid x 7 d, return if std testing is positive or fu with the health department.     Versie Starks, PA-C 08/22/19 1445    Earleen Newport, MD 08/22/19 1452

## 2019-08-22 NOTE — ED Provider Notes (Signed)
[facility] Emergency Department Provider Note  ____________________________________________   None    (approximate)   I have reviewed the triage vital signs and the nursing notes.   Patient has been triaged with a MSE exam performed by myself at a minimum. Based on symptoms and screening exam, patient may receive a more in-depth exam, labs, imaging as detailed below. Patients have been advised of this setting and exam type at the time of patient interview.    HISTORY  Chief Complaint No chief complaint on file.    HPI Sylvia George is a 33 y.o. female presents to the emergency department with a complaint of presents emergency department complaining of 1 week pain after voiding.  States she had unprotected sex.  Patient also states this person has been noted to have GC/chlamydia.  She denies any vaginal discharge this time.  Did have pain with sex.  No fever or chills..   Patient will receive a medical screening exam as detailed below.  Based off of this exam, more in depth exam, labs, imaging will be performed as needed for complaint.  Patient care will be eventually transferred to another provider in the emergency department for final exam, diagnosis and disposition.    Past Medical History:  Diagnosis Date  . Headache     Patient Active Problem List   Diagnosis Date Noted  . Dyspepsia 05/27/2017  . Chronic pelvic pain in female 04/24/2016  . Dysmenorrhea 01/05/2013  . Endometriosis 01/05/2013    Past Surgical History:  Procedure Laterality Date  . CYSTOSCOPY  04/24/2016   Procedure: CYSTOSCOPY;  Surgeon: Gae Dry, MD;  Location: [facility] ORS;  Service: Gynecology;;  . DILATION AND CURETTAGE OF UTERUS    . DILITATION & CURRETTAGE/HYSTROSCOPY WITH ESSURE    . LAPAROSCOPIC HYSTERECTOMY Bilateral 04/24/2016   Procedure: HYSTERECTOMY TOTAL LAPAROSCOPIC / BILATERAL SALPINGECTOMY;  Surgeon: Gae Dry, MD;  Location: [facility] ORS;  Service:  Gynecology;  Laterality: Bilateral;  . LAPAROSCOPIC OVARIAN CYSTECTOMY Right 04/24/2016   Procedure: LAPAROSCOPIC OVARIAN CYSTECTOMY;  Surgeon: Gae Dry, MD;  Location: [facility] ORS;  Service: Gynecology;  Laterality: Right;  . SALPINGECTOMY      Prior to Admission medications   Medication Sig Start Date End Date Taking? Authorizing Provider  famotidine (PEPCID) 20 MG tablet Take 1 tablet (20 mg total) by mouth 2 (two) times daily. 07/20/19   Carrie Mew, MD  ketorolac (TORADOL) 10 MG tablet Take 1 tablet (10 mg total) by mouth every 8 (eight) hours as needed for moderate pain (with food). Patient not taking: Reported on 07/20/2019 03/08/18   Eula Listen, MD  meclizine (ANTIVERT) 25 MG tablet Take 1 tablet (25 mg total) by mouth 3 (three) times daily as needed for dizziness. Patient not taking: Reported on 07/20/2019 10/26/17   Hinda Kehr, MD  omeprazole (PRILOSEC) 20 MG capsule Take 1 capsule (20 mg total) by mouth 2 (two) times daily before a meal. Patient not taking: Reported on 10/23/2017 05/27/17 06/26/17  Lin Landsman, MD  ondansetron (ZOFRAN ODT) 4 MG disintegrating tablet Take 1 tablet (4 mg total) by mouth every 8 (eight) hours as needed for nausea or vomiting. 07/20/19   Carrie Mew, MD  prochlorperazine (COMPAZINE) 10 MG tablet Take 1 tablet (10 mg total) by mouth every 6 (six) hours as needed for nausea or vomiting. Patient not taking: Reported on 07/20/2019 03/08/18   Eula Listen, MD    Allergies Vicodin [hydrocodone-acetaminophen]  Family History  Problem Relation  Age of Onset  . Diabetes Mother   . Hypertension Mother   . Heart attack Father     Social History Social History   Tobacco Use  . Smoking status: Current Every Day Smoker    Packs/day: 1.00    Types: Cigarettes  . Smokeless tobacco: Never Used  Substance Use Topics  . Alcohol use: No  . Drug use: No    Review of Systems Constitutional: Denies fever ENT: Denies  nasal congestion/rhinorhea.  Denies sore throat Cardiovascular: Denies chest pain. Respiratory: Denies cough.  Denies shortness of breath/difficulty breathing Gastroenterology: Denies abdominal pain GU: Positive for dysuria and unprotected sex Musculoskeletal: Denies for musculoskeletal pain Integumentary: Negative for rash. Neurological: No focal weakness nor numbness.   ____________________________________________   PHYSICAL EXAM:  VITAL SIGNS: ED Triage Vitals  Enc Vitals Group     BP 08/22/19 1257 (!) 111/55     Pulse Rate 08/22/19 1257 72     Resp 08/22/19 1257 16     Temp 08/22/19 1257 98.1 F (36.7 C)     Temp Source 08/22/19 1257 Oral     SpO2 08/22/19 1257 100 %     Weight 08/22/19 1253 167 lb 9.5 oz (76 kg)     Height 08/22/19 1253 5\' 2"  (1.575 m)     Head Circumference --      Peak Flow --      Pain Score 08/22/19 1253 0     Pain Loc --      Pain Edu? --      Excl. in [place]? --     Constitutional: Alert and oriented. Generally well appearing and in no acute distress. Eyes: Conjunctivae are normal.  Nose: No significant congestion/rhinnorhea. Mouth: No gross oropharyngeal edema.   Neck: No stridor.  No meningeal signs.   Cardiovascular: Grossly normal heart sounds. Respiratory: Normal respiratory effort without significant tachypnea and no observed retractions. Lungs CTA Gastrointestinal: No significant visible abdominal wall findings.  Bowel sounds x4 quadrants.  Nontender to palpation. Musculoskeletal: No gross deformities of extremities. Neurologic:  Normal speech and language. No gross focal neurologic deficits are appreciated.  Skin:  Skin is warm, dry and intact. No rash noted.    ____________________________________________   LABS (all labs ordered are listed, but only abnormal results are displayed)  Labs Reviewed  GC/CHLAMYDIA PROBE AMP  URINALYSIS, COMPLETE (UACMP) WITH MICROSCOPIC  POC URINE PREG, ED     ____________________________________________   RADIOLOGY   Official radiology report(s): No results found.  ____________________________________________    INITIAL IMPRESSION / MDM / ASSESSMENT AND PLAN / ED COURSE  As part of my medical decision making, I reviewed the following data within the [place] notes reviewed and incorporated, Old chart reviewed, Notes from prior ED visits and [place] Controlled Substance Database      Clinical Impression: UTI, STD  Plan: UA, GC/chlamydia, Poc pregnant  Patient has been screened based based on their arrival complaint, evaluated for an emergent condition, and at a minimum has received a medical screening exam.  At this time, patient will receive further work-up as determined by medical screening exam.  Patient care will eventually be transferred to another provider in the emergency department for final diagnosis and disposition.    ____________________________________________  Note:  This document was prepared using Systems analyst and may include unintentional dictation errors.    Versie Starks, PA-C 08/22/19 1302    Earleen Newport, MD 08/22/19 [phone]

## 2019-08-22 NOTE — ED Triage Notes (Signed)
C/O pressure after voiding x 1 week.  Denies urgency or frequency.  Denies vaginal discharge.  States had unprotected sex with someone diagnosed with GC/ Chlamydia in the past.

## 2019-08-25 LAB — GC/CHLAMYDIA PROBE AMP
Chlamydia trachomatis, NAA: NEGATIVE
Neisseria Gonorrhoeae by PCR: NEGATIVE

## 2020-01-09 ENCOUNTER — Emergency Department: Payer: Self-pay

## 2020-01-09 ENCOUNTER — Other Ambulatory Visit: Payer: Self-pay

## 2020-01-09 ENCOUNTER — Emergency Department
Admission: EM | Admit: 2020-01-09 | Discharge: 2020-01-09 | Disposition: A | Discharge status: Home / Self Care | Payer: Self-pay | Attending: Emergency Medicine | Admitting: Emergency Medicine

## 2020-01-09 ENCOUNTER — Encounter: Payer: Self-pay | Admitting: Emergency Medicine

## 2020-01-09 DIAGNOSIS — Z79899 Other long term (current) drug therapy: Secondary | ICD-10-CM | POA: Insufficient documentation

## 2020-01-09 DIAGNOSIS — R079 Chest pain, unspecified: Secondary | ICD-10-CM

## 2020-01-09 DIAGNOSIS — R1013 Epigastric pain: Secondary | ICD-10-CM | POA: Insufficient documentation

## 2020-01-09 DIAGNOSIS — K219 Gastro-esophageal reflux disease without esophagitis: Secondary | ICD-10-CM | POA: Insufficient documentation

## 2020-01-09 DIAGNOSIS — F17210 Nicotine dependence, cigarettes, uncomplicated: Secondary | ICD-10-CM | POA: Insufficient documentation

## 2020-01-09 LAB — URINALYSIS, COMPLETE (UACMP) WITH MICROSCOPIC
Bacteria, UA: NONE SEEN
Bilirubin Urine: NEGATIVE
Glucose, UA: NEGATIVE mg/dL
Hgb urine dipstick: NEGATIVE
Ketones, ur: NEGATIVE mg/dL
Leukocytes,Ua: NEGATIVE
Nitrite: NEGATIVE
Protein, ur: NEGATIVE mg/dL
Specific Gravity, Urine: 1.012 (ref 1.005–1.030)
Squamous Epithelial / HPF: NONE SEEN (ref 0–5)
WBC, UA: NONE SEEN WBC/hpf (ref 0–5)
pH: 7 (ref 5.0–8.0)

## 2020-01-09 LAB — CBC WITH DIFFERENTIAL/PLATELET
Abs Immature Granulocytes: 0.05 10*3/uL (ref 0.00–0.07)
Basophils Absolute: 0 10*3/uL (ref 0.0–0.1)
Basophils Relative: 0 %
Eosinophils Absolute: 0.2 10*3/uL (ref 0.0–0.5)
Eosinophils Relative: 1 %
HCT: 39.2 % (ref 36.0–46.0)
Hemoglobin: 13.5 g/dL (ref 12.0–15.0)
Immature Granulocytes: 0 %
Lymphocytes Relative: 36 %
Lymphs Abs: 4.7 10*3/uL — ABNORMAL HIGH (ref 0.7–4.0)
MCH: 31.2 pg (ref 26.0–34.0)
MCHC: 34.4 g/dL (ref 30.0–36.0)
MCV: 90.5 fL (ref 80.0–100.0)
Monocytes Absolute: 0.6 10*3/uL (ref 0.1–1.0)
Monocytes Relative: 5 %
Neutro Abs: 7.6 10*3/uL (ref 1.7–7.7)
Neutrophils Relative %: 58 %
Platelets: 363 10*3/uL (ref 150–400)
RBC: 4.33 MIL/uL (ref 3.87–5.11)
RDW: 11.4 % — ABNORMAL LOW (ref 11.5–15.5)
Smear Review: NORMAL
WBC: 13.2 10*3/uL — ABNORMAL HIGH (ref 4.0–10.5)
nRBC: 0 % (ref 0.0–0.2)

## 2020-01-09 LAB — COMPREHENSIVE METABOLIC PANEL
ALT: 9 U/L (ref 0–44)
AST: 17 U/L (ref 15–41)
Albumin: 4.4 g/dL (ref 3.5–5.0)
Alkaline Phosphatase: 69 U/L (ref 38–126)
Anion gap: 8 (ref 5–15)
BUN: 10 mg/dL (ref 6–20)
CO2: 25 mmol/L (ref 22–32)
Calcium: 9.6 mg/dL (ref 8.9–10.3)
Chloride: 104 mmol/L (ref 98–111)
Creatinine, Ser: 0.6 mg/dL (ref 0.44–1.00)
GFR calc Af Amer: >60 mL/min (ref >60)
GFR calc non Af Amer: >60 mL/min (ref >60)
Glucose, Bld: 87 mg/dL (ref 70–99)
Potassium: 3.5 mmol/L (ref 3.5–5.1)
Sodium: 137 mmol/L (ref 135–145)
Total Bilirubin: 0.6 mg/dL (ref 0.3–1.2)
Total Protein: 7.8 g/dL (ref 6.5–8.1)

## 2020-01-09 LAB — LIPASE, BLOOD: Lipase: 26 U/L (ref 11–51)

## 2020-01-09 LAB — TROPONIN I (HIGH SENSITIVITY)
Troponin I (High Sensitivity): 3 ng/L (ref <18)
Troponin I (High Sensitivity): <2 ng/L (ref <18)

## 2020-01-09 MED ORDER — SODIUM CHLORIDE 0.9 % IV BOLUS
1000.0000 mL | Freq: Once | INTRAVENOUS | Status: AC
  Administered 2020-01-09: 1000 mL via INTRAVENOUS

## 2020-01-09 MED ORDER — IOHEXOL 300 MG/ML  SOLN
100.0000 mL | Freq: Once | INTRAMUSCULAR | Status: AC | PRN
  Administered 2020-01-09: 20:00:00 100 mL via INTRAVENOUS
  Filled 2020-01-09: qty 100

## 2020-01-09 MED ORDER — OMEPRAZOLE 10 MG PO CPDR: 10.0000 mg | DELAYED_RELEASE_CAPSULE | Freq: Every day | ORAL | 6 refills | Status: AC

## 2020-01-09 MED ORDER — IOHEXOL 9 MG/ML PO SOLN
500.0000 mL | Freq: Two times a day (BID) | ORAL | Status: DC | PRN
  Administered 2020-01-09: 500 mL via ORAL
  Filled 2020-01-09 (×2): qty 500

## 2020-01-09 NOTE — ED Triage Notes (Signed)
Pt presents to ED via POV with c/o mid upper abdominal pain that radiates up behind her sternum x 2 weeks. Pt also c/o nausea. Pt also c/o HA with pain. Pt states pain 5/10, denies vomiting at this time. Pt also c/o increased pain with yawning.

## 2020-01-09 NOTE — ED Provider Notes (Addendum)
[facility] Emergency Department Provider Note  ____________________________________________   None    (approximate)   I have reviewed the triage vital signs and the nursing notes.   Patient has been triaged with a MSE exam performed by myself at a minimum. Based on symptoms and screening exam, patient may receive a more in-depth exam, labs, imaging as detailed below. Patients have been advised of this setting and exam type at the time of patient interview.    HISTORY  Chief Complaint Abdominal Cramping    HPI Sylvia George is a 34 y.o. female presents to the emergency department with a complaint of epigastric pain radiating into the chest.  Patient presented with several day history epigastric pain that worsens with a yawn.  When patient yawns, pain radiates into her chest.  No ongoing, constant chest pain.  No radiation to the neck or arm.  No fevers or chills.  Patient has been nauseated but no emesis.  No diarrhea or constipation.  No urinary complaints..   Patient will receive a medical screening exam as detailed below.  Based off of this exam, more in depth exam, labs, imaging will be performed as needed for complaint.  Patient care will be eventually transferred to another provider in the emergency department for final exam, diagnosis and disposition.    Past Medical History:  Diagnosis Date  . Headache     Patient Active Problem List   Diagnosis Date Noted  . Dyspepsia 05/27/2017  . Chronic pelvic pain in female 04/24/2016  . Dysmenorrhea 01/05/2013  . Endometriosis 01/05/2013    Past Surgical History:  Procedure Laterality Date  . CYSTOSCOPY  04/24/2016   Procedure: CYSTOSCOPY;  Surgeon: Gae Dry, MD;  Location: [facility] ORS;  Service: Gynecology;;  . DILATION AND CURETTAGE OF UTERUS    . DILITATION & CURRETTAGE/HYSTROSCOPY WITH ESSURE    . LAPAROSCOPIC HYSTERECTOMY Bilateral 04/24/2016   Procedure: HYSTERECTOMY TOTAL  LAPAROSCOPIC / BILATERAL SALPINGECTOMY;  Surgeon: Gae Dry, MD;  Location: [facility] ORS;  Service: Gynecology;  Laterality: Bilateral;  . LAPAROSCOPIC OVARIAN CYSTECTOMY Right 04/24/2016   Procedure: LAPAROSCOPIC OVARIAN CYSTECTOMY;  Surgeon: Gae Dry, MD;  Location: [facility] ORS;  Service: Gynecology;  Laterality: Right;  . SALPINGECTOMY      Prior to Admission medications   Medication Sig Start Date End Date Taking? Authorizing Provider  cephALEXin (KEFLEX) 500 MG capsule Take 1 capsule (500 mg total) by mouth 3 (three) times daily. 08/22/19   Fisher, Linden Dolin, PA-C  famotidine (PEPCID) 20 MG tablet Take 1 tablet (20 mg total) by mouth 2 (two) times daily. 07/20/19   Carrie Mew, MD  ketorolac (TORADOL) 10 MG tablet Take 1 tablet (10 mg total) by mouth every 8 (eight) hours as needed for moderate pain (with food). Patient not taking: Reported on 07/20/2019 03/08/18   Eula Listen, MD  meclizine (ANTIVERT) 25 MG tablet Take 1 tablet (25 mg total) by mouth 3 (three) times daily as needed for dizziness. Patient not taking: Reported on 07/20/2019 10/26/17   Hinda Kehr, MD  omeprazole (PRILOSEC) 10 MG capsule Take 1 capsule (10 mg total) by mouth daily. 01/09/20   Rheda Kassab, Charline Bills, PA-C  ondansetron (ZOFRAN ODT) 4 MG disintegrating tablet Take 1 tablet (4 mg total) by mouth every 8 (eight) hours as needed for nausea or vomiting. 07/20/19   Carrie Mew, MD  prochlorperazine (COMPAZINE) 10 MG tablet Take 1 tablet (10 mg total) by mouth every 6 (six) hours as needed  for nausea or vomiting. Patient not taking: Reported on 07/20/2019 03/08/18   Eula Listen, MD    Allergies Vicodin [hydrocodone-acetaminophen]  Family History  Problem Relation Age of Onset  . Diabetes Mother   . Hypertension Mother   . Heart attack Father     Social History Social History   Tobacco Use  . Smoking status: Current Every Day Smoker    Packs/day: 1.00    Types: Cigarettes    . Smokeless tobacco: Never Used  Substance Use Topics  . Alcohol use: No  . Drug use: No    Review of Systems Constitutional: no fever ENT: no nasal congestion/rhinorhea. no sore throat Cardiovascular: epigastric pain radiating into chest pain. Respiratory: no cough. no shortness of breath/difficulty breathing Gastroenterology: epigastric abdominal pain Musculoskeletal: no for musculoskeletal pain Integumentary: Negative for rash. Neurological: No focal weakness nor numbness.   ____________________________________________   PHYSICAL EXAM:  VITAL SIGNS: ED Triage Vitals  Enc Vitals Group     BP 01/09/20 1655 (!) 124/57     Pulse Rate 01/09/20 1655 82     Resp 01/09/20 1655 20     Temp 01/09/20 1655 98.7 F (37.1 C)     Temp Source 01/09/20 1655 Oral     SpO2 01/09/20 1655 99 %     Weight 01/09/20 1658 175 lb (79.4 kg)     Height 01/09/20 1658 5\' 1"  (1.549 m)     Head Circumference --      Peak Flow --      Pain Score 01/09/20 1656 5     Pain Loc --      Pain Edu? --      Excl. in [place]? --     Constitutional: Alert and oriented. Generally well appearing and in no acute distress. Eyes: Conjunctivae are normal.  Nose: No significant congestion/rhinnorhea. Mouth: No gross oropharyngeal edema. no erythema/edema Neck: No stridor.  No meningeal signs.   Cardiovascular: Grossly normal heart sounds. Respiratory: Normal respiratory effort without significant tachypnea and no observed retractions. Lungs CTAB Gastrointestinal: No significant visible abdominal wall findings.  Bowel sounds x4 quadrants.  Point tenderness in the epigastric region just inferior to the xiphoid process Musculoskeletal: No gross deformities of extremities. Neurologic:  Normal speech and language. No gross focal neurologic deficits are appreciated.  Skin:  Skin is warm, dry and intact. No rash noted.    ____________________________________________   LABS (all labs ordered are listed, but only  abnormal results are displayed)  Labs Reviewed  CBC WITH DIFFERENTIAL/PLATELET - Abnormal; Notable for the following components:      Result Value   WBC 13.2 (*)    RDW 11.4 (*)    Lymphs Abs 4.7 (*)    All other components within normal limits  URINALYSIS, COMPLETE (UACMP) WITH MICROSCOPIC - Abnormal; Notable for the following components:   Color, Urine YELLOW (*)    APPearance HAZY (*)    All other components within normal limits  COMPREHENSIVE METABOLIC PANEL  LIPASE, BLOOD  TROPONIN I (HIGH SENSITIVITY)  TROPONIN I (HIGH SENSITIVITY)    ____________________________________________   RADIOLOGY   Official radiology report(s): DG Chest 2 View  Result Date: 01/09/2020 CLINICAL DATA:  Upper abdominal pain radiating retrosternal EXAM: CHEST - 2 VIEW COMPARISON:  None. FINDINGS: No consolidation, features of edema, pneumothorax, or effusion. Pulmonary vascularity is normally distributed. The cardiomediastinal contours are unremarkable. No acute osseous or soft tissue abnormality. IMPRESSION: No acute cardiopulmonary abnormality. Electronically Signed   By: Elwin Sleight.D.  On: 01/09/2020 18:07   CT ABDOMEN PELVIS W CONTRAST  Result Date: 01/09/2020 CLINICAL DATA:  Epigastric pain radiating into the chest for the past few days. EXAM: CT ABDOMEN AND PELVIS WITH CONTRAST TECHNIQUE: Multidetector CT imaging of the abdomen and pelvis was performed using the standard protocol following bolus administration of intravenous contrast. CONTRAST:  175mL OMNIPAQUE IOHEXOL 300 MG/ML  SOLN COMPARISON:  Right upper quadrant ultrasound dated November 14, 2016. CT abdomen pelvis dated September 24, 2015. FINDINGS: Lower chest: No acute abnormality. Hepatobiliary: No focal liver abnormality is seen. No gallstones, gallbladder wall thickening, or biliary dilatation. Pancreas: Unremarkable. No pancreatic ductal dilatation or surrounding inflammatory changes. Spleen: Normal in size without focal  abnormality. Adrenals/Urinary Tract: Adrenal glands are unremarkable. Kidneys are normal, without renal calculi, focal lesion, or hydronephrosis. Bladder is unremarkable. Stomach/Bowel: Stomach is within normal limits. Appendix appears normal. No evidence of bowel wall thickening, distention, or inflammatory changes. Vascular/Lymphatic: No significant vascular findings are present. No enlarged abdominal or pelvic lymph nodes. Reproductive: Status post hysterectomy. No adnexal masses. Other: No free fluid or pneumoperitoneum. Musculoskeletal: No acute or significant osseous findings. IMPRESSION: No acute intra-abdominal process. Electronically Signed   By: Titus Dubin M.D.   On: 01/09/2020 20:28    ____________________________________________    INITIAL IMPRESSION / MDM / [place] / ED COURSE  As part of my medical decision making, I reviewed the following data within the electronic MEDICAL RECORD NUMBER Notes from prior ED visits and [place] Controlled Substance Database      Clinical Impression: Epigastric abdominal pain  Plan: EKG, chest x-ray, labs, urinalysis  Patient has been screened based based on their arrival complaint, evaluated for an emergent condition, and at a minimum has received a medical screening exam.  At this time, patient will receive the further work-up listed above that was determined by medical screening exam.  Patient care will be transferred to another provider in the emergency department once patient is roomed for final diagnosis and disposition.  ----------------------------------------- 6:58 PM on 01/09/2020 -----------------------------------------  Patient has been placed into one of my exam rooms.  I will continue care for the patient.  At this time, labs are overall reassuring.  Slight elevation of white blood cell count.  Patient remains very tender to palpation in the epigastric region.  Lipase is reassuring.  LFTs are reassuring.  Discussing  differential with the patient she seems very anxious and at this time we will proceed with imaging.  CT scan of the abdomen pelvis has been ordered at this time.  ----------------------------------------- 9:07 PM on 01/09/2020 -----------------------------------------  Patient's findings are consistent with GERD.  No evidence for cholecystitis, pancreatitis, hepatitis.  Patient has a history of GERD.  Symptoms are consistent with GERD.  Patient states that she had been previously prescribed medication for same but has not taken any medication in a long period of time.  I will place the patient on omeprazole.  Follow-up with primary care as needed.  ED diagnosis: Epigastric pain GERD  Discharge medications: Omeprazole  ____________________________________________  Note:  This document was prepared using Dragon voice recognition software and may include unintentional dictation errors.    Darletta Moll, PA-C 01/09/20 1713    Jeanifer Halliday, Charline Bills, PA-C 01/09/20 2110    Arta Silence, MD 01/09/20 2119

## 2020-10-30 ENCOUNTER — Ambulatory Visit: Payer: Self-pay | Admitting: *Deleted

## 2020-10-30 NOTE — Telephone Encounter (Signed)
   Reason for Disposition . 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 COVID-19 EXPOSURE: "Please describe how you were exposed to someone with a COVID-19 infection."     Family members tested + COVID 2. PLACE of CONTACT: "Where were you when you were exposed to COVID-19?" (e.g., home, school, medical waiting room; which city?)     In the home- visiting 3. TYPE of CONTACT: "How much contact was there?" (e.g., sitting next to, live in same house, work in same office, same building)     Siting in the room 4. DURATION of CONTACT: "How long were you in contact with the COVID-19 patient?" (e.g., a few seconds, passed by person, a few minutes, 15 minutes or longer, live with the patient)     Longer than 15 minutes 5. MASK: "Were you wearing a mask?" "Was the other person wearing a mask?" Note: wearing a mask reduces the risk of an otherwise close contact.     no 6. DATE of CONTACT: "When did you have contact with a COVID-19 patient?" (e.g., how many days ago)     1/26 7. COMMUNITY SPREAD: "Are there lots of cases of COVID-19 (community spread) where you live?" (See public health department website, if unsure)       yes 8. SYMPTOMS: "Do you have any symptoms?" (e.g., fever, cough, breathing difficulty, loss of taste or smell)     Sinus congestion 9. VACCINE: "Have you gotten the COVID-19 vaccine?" If Yes ask: "Which one, how many shots, when did you get it?"     Yes- pfizer  10. PREGNANCY OR POSTPARTUM: "Is there any chance you are pregnant?" "When was your last menstrual period?" "Did you deliver in the last 2 weeks?"       no 11. HIGH RISK: "Do you have any heart or lung problems?" "Do you have a weak immune system?" (e.g., heart failure, COPD, asthma, HIV positive, chemotherapy, renal failure, diabetes mellitus, sickle cell  anemia, obesity)       no 12. TRAVEL: "Have you traveled out of the country recently?" If Yes, ask: "When and where?" Also ask about out-of-state travel, since the CDC has identified some high-risk cities for community spread in the [place]. Note: Travel becomes less relevant if there is widespread community transmission where the patient lives.       COVID exposure- family  Protocols used: CORONAVIRUS (COVID-19) DIAGNOSED OR SUSPECTED-A-AH, CORONAVIRUS (COVID-19) EXPOSURE-A-AH

## 2020-12-18 IMAGING — CR DG CHEST 2V
1 series · 2 of 2 positions shown · non-contrast
Comparison: None.

CLINICAL DATA: Upper abdominal pain radiating retrosternal

EXAM:
CHEST - 2 VIEW

[Series 1: dg chest 2 view · 0.14mm/px · 2 of 2 slices shown]
[im 1/2]
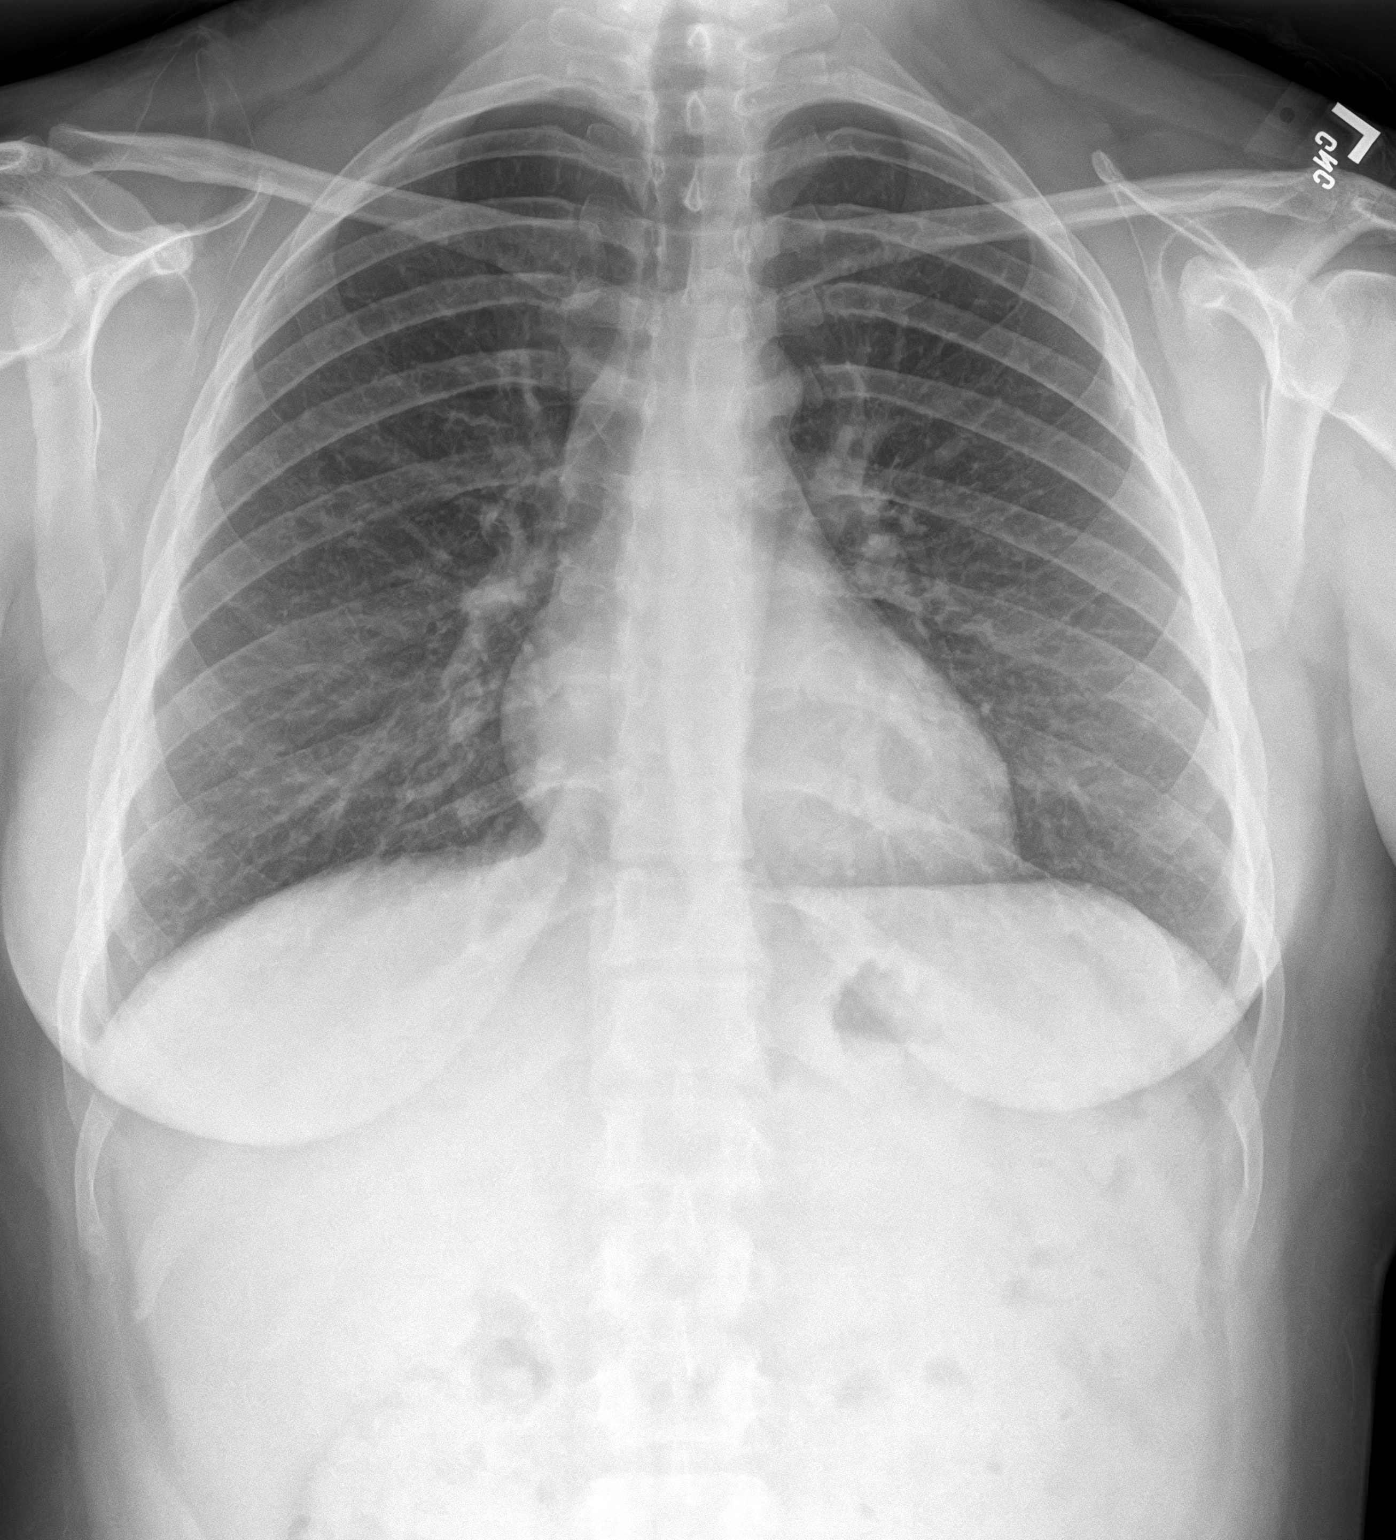
[im 2/2]
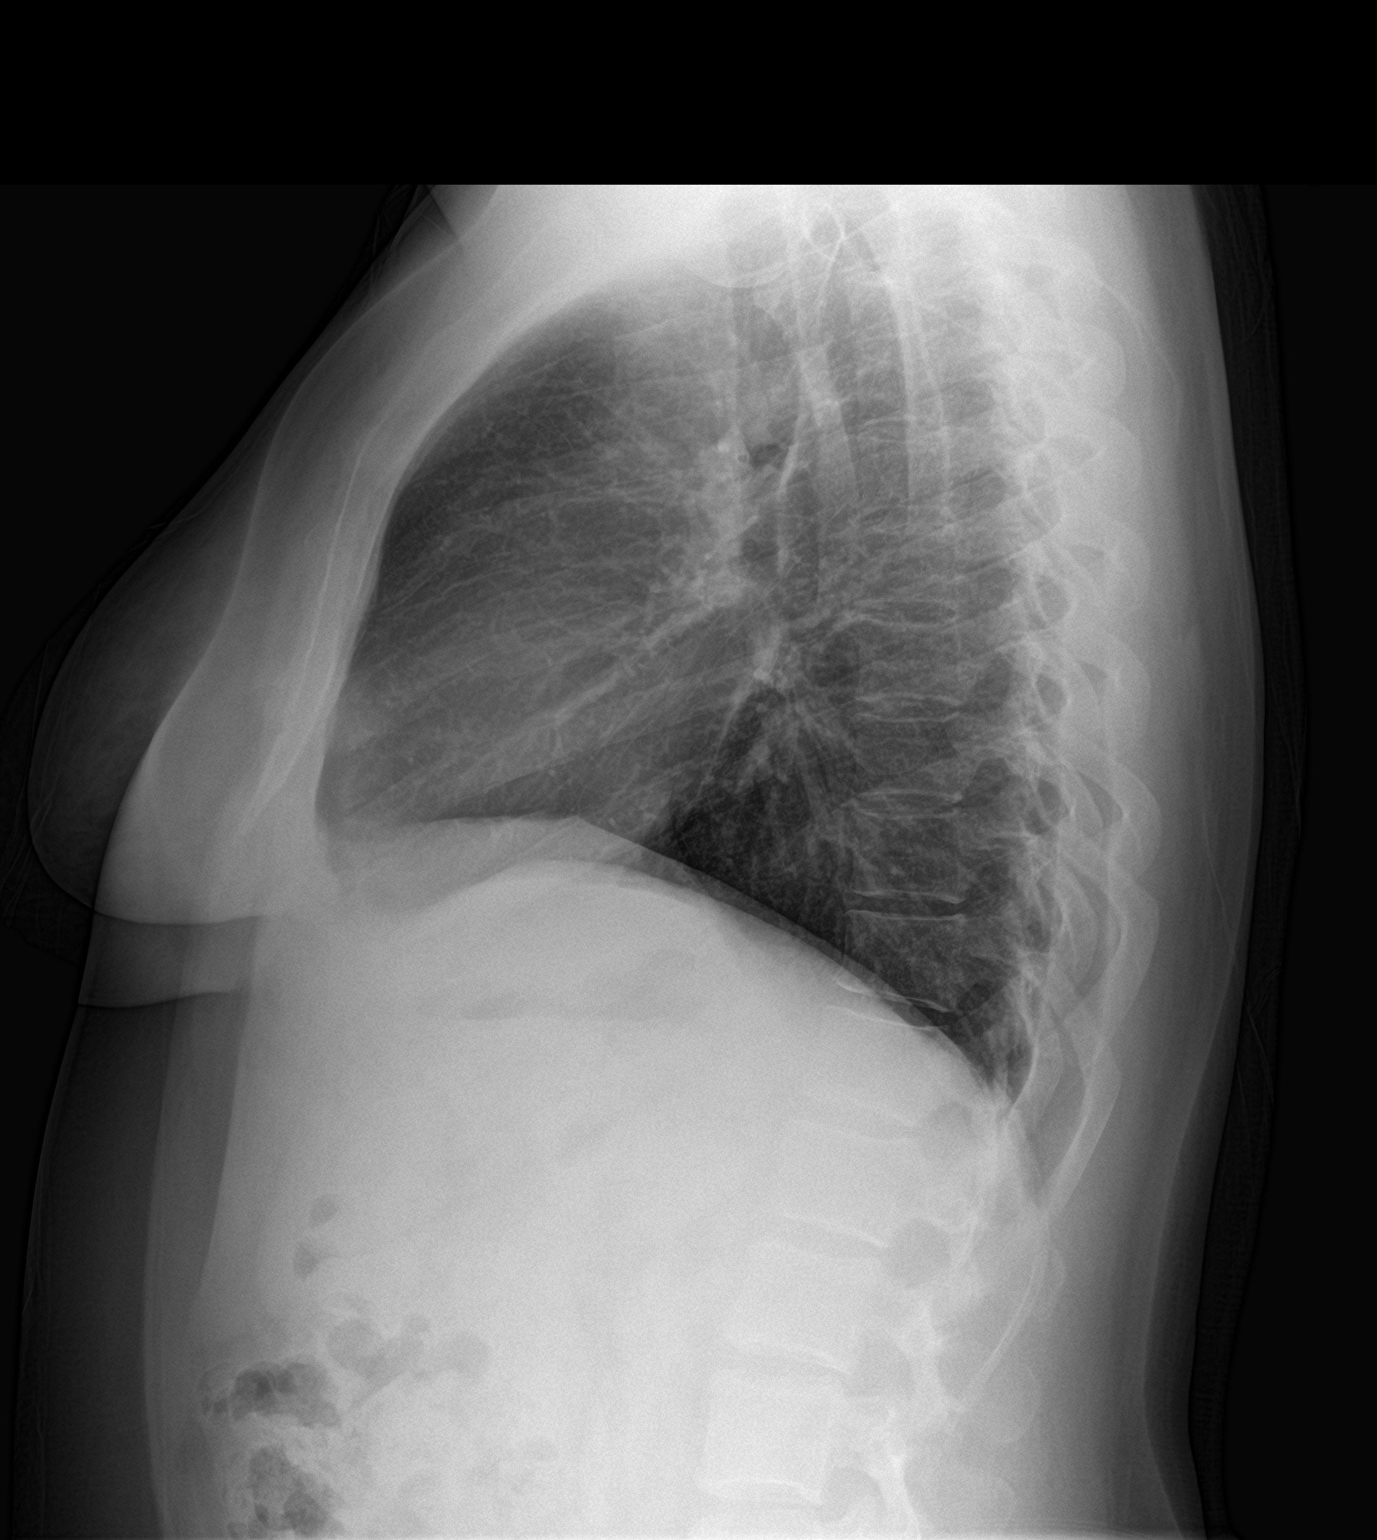

[2 of 2 positions shown; findings below may reference images not displayed]

FINDINGS: No consolidation, features of edema, pneumothorax, or effusion.
Pulmonary vascularity is normally distributed. The cardiomediastinal
contours are unremarkable. No acute osseous or soft tissue
abnormality.
IMPRESSION: No acute cardiopulmonary abnormality.

## 2021-08-12 ENCOUNTER — Ambulatory Visit: Payer: Self-pay

## 2021-08-12 NOTE — Telephone Encounter (Signed)
2nd attempt to reach pt regarding symptoms.

## 2021-08-12 NOTE — Telephone Encounter (Signed)
Patient called, left VM to return the call back to [phone] to discuss symptoms with a nurse.  Message from Scherrie Gerlach sent at 08/12/2021  8:43 AM EST  Pt has been sick 4 weeks.  She does not know if it is allergies or something else.  Pt has no insurance, and all UC is $300+.  Pt doesn't know what to do.
